# Patient Record
Sex: Male | Born: 1945 | Race: Black or African American | Hispanic: No | Marital: Single | Smoking: Former smoker
Health system: Southern US, Community
[De-identification: ages and names within clinical notes are randomized; demographics above are authoritative.]

## PROBLEM LIST (undated history)

## (undated) DIAGNOSIS — M1990 Unspecified osteoarthritis, unspecified site: Secondary | ICD-10-CM

## (undated) DIAGNOSIS — I10 Essential (primary) hypertension: Secondary | ICD-10-CM

## (undated) HISTORY — DX: Unspecified osteoarthritis, unspecified site: M19.90

---

## 2006-01-06 ENCOUNTER — Emergency Department: Payer: Self-pay | Admitting: Internal Medicine

## 2006-05-17 ENCOUNTER — Ambulatory Visit: Payer: Self-pay | Admitting: Specialist

## 2007-08-30 HISTORY — PX: COLONOSCOPY: SHX174

## 2007-09-11 ENCOUNTER — Encounter: Payer: Self-pay | Admitting: Family Medicine

## 2007-09-30 ENCOUNTER — Encounter: Payer: Self-pay | Admitting: Family Medicine

## 2008-12-15 ENCOUNTER — Ambulatory Visit: Payer: Self-pay | Admitting: Gastroenterology

## 2010-07-07 ENCOUNTER — Ambulatory Visit: Payer: Self-pay | Admitting: Family Medicine

## 2010-07-09 ENCOUNTER — Ambulatory Visit: Payer: Self-pay | Admitting: Internal Medicine

## 2010-12-04 ENCOUNTER — Ambulatory Visit: Payer: Self-pay | Admitting: Family Medicine

## 2012-08-29 HISTORY — PX: PROSTATE BIOPSY: SHX241

## 2014-04-24 LAB — BASIC METABOLIC PANEL
Creatinine: 12 mg/dL — AB (ref <=1.3)
GLUCOSE: 1 mg/dL

## 2014-04-24 LAB — LIPID PANEL
Cholesterol: 213 mg/dL — AB (ref 0–200)
HDL: 25 mg/dL — AB (ref 35–70)
TRIGLYCERIDES: 521 mg/dL — AB (ref 40–160)

## 2014-04-24 LAB — TSH: TSH: 1.1 u[IU]/mL (ref <=5.90)

## 2014-04-24 LAB — PSA: PSA: 0.6

## 2014-08-19 ENCOUNTER — Ambulatory Visit: Payer: Self-pay | Admitting: Emergency Medicine

## 2014-12-08 ENCOUNTER — Ambulatory Visit
Admit: 2014-12-08 | Disposition: A | Discharge status: Home / Self Care | Payer: Self-pay | Attending: Family Medicine | Admitting: Family Medicine

## 2015-04-09 ENCOUNTER — Other Ambulatory Visit: Payer: Self-pay | Admitting: Internal Medicine

## 2015-04-11 ENCOUNTER — Other Ambulatory Visit: Payer: Self-pay | Admitting: Internal Medicine

## 2015-04-11 ENCOUNTER — Encounter: Payer: Self-pay | Admitting: Internal Medicine

## 2015-04-11 DIAGNOSIS — F17201 Nicotine dependence, unspecified, in remission: Secondary | ICD-10-CM | POA: Insufficient documentation

## 2015-04-11 DIAGNOSIS — M109 Gout, unspecified: Secondary | ICD-10-CM | POA: Insufficient documentation

## 2015-04-11 DIAGNOSIS — R972 Elevated prostate specific antigen [PSA]: Secondary | ICD-10-CM

## 2015-04-11 DIAGNOSIS — D869 Sarcoidosis, unspecified: Secondary | ICD-10-CM | POA: Insufficient documentation

## 2015-04-11 DIAGNOSIS — I10 Essential (primary) hypertension: Secondary | ICD-10-CM | POA: Insufficient documentation

## 2015-04-11 DIAGNOSIS — J3089 Other allergic rhinitis: Secondary | ICD-10-CM | POA: Insufficient documentation

## 2015-04-11 DIAGNOSIS — E785 Hyperlipidemia, unspecified: Secondary | ICD-10-CM | POA: Insufficient documentation

## 2015-05-03 ENCOUNTER — Other Ambulatory Visit: Payer: Self-pay | Admitting: Internal Medicine

## 2015-07-02 ENCOUNTER — Ambulatory Visit: Payer: Self-pay | Admitting: Internal Medicine

## 2015-08-04 ENCOUNTER — Encounter: Payer: Self-pay | Admitting: Internal Medicine

## 2015-08-04 ENCOUNTER — Ambulatory Visit (INDEPENDENT_AMBULATORY_CARE_PROVIDER_SITE_OTHER): Payer: Medicare HMO | Admitting: Internal Medicine

## 2015-08-04 VITALS — BP 150/82 | HR 84 | Ht 64.0 in | Wt 203.8 lb

## 2015-08-04 DIAGNOSIS — E785 Hyperlipidemia, unspecified: Secondary | ICD-10-CM

## 2015-08-04 DIAGNOSIS — I10 Essential (primary) hypertension: Secondary | ICD-10-CM | POA: Diagnosis not present

## 2015-08-04 DIAGNOSIS — J3089 Other allergic rhinitis: Secondary | ICD-10-CM | POA: Diagnosis not present

## 2015-08-04 DIAGNOSIS — M1000 Idiopathic gout, unspecified site: Secondary | ICD-10-CM

## 2015-08-04 MED ORDER — ATORVASTATIN CALCIUM 20 MG PO TABS: 20.0000 mg | ORAL_TABLET | Freq: Every day | ORAL | Status: DC

## 2015-08-04 MED ORDER — AMLODIPINE BESYLATE 10 MG PO TABS: 10.0000 mg | ORAL_TABLET | Freq: Every day | ORAL | Status: DC

## 2015-08-04 MED ORDER — IRBESARTAN 300 MG PO TABS: 300.0000 mg | ORAL_TABLET | Freq: Every day | ORAL | Status: DC

## 2015-08-04 MED ORDER — CARVEDILOL PHOSPHATE ER 20 MG PO CP24: 20.0000 mg | ORAL_CAPSULE | Freq: Every day | ORAL | Status: DC

## 2015-08-04 NOTE — Patient Instructions (Signed)
Take Allegra 180 mg once a day (plain allegra) Continue to use Flonase Nasal spray daily

## 2015-08-04 NOTE — Progress Notes (Signed)
Date:  08/04/2015   Name:  Edward Rogers   DOB:  [birth year]   MRN:  [id number]   Chief Complaint: Hypertension Hypertension This is a chronic problem. The current episode started more than 1 year ago. Pertinent negatives include no blurred vision, chest pain, headaches, palpitations or shortness of breath. Past treatments include calcium channel blockers and angiotensin blockers. Compliance problems include medication side effects (A.m. amlodipine dose makes him sleepy).   Hyperlipidemia This is a chronic problem. The current episode started more than 1 year ago. The problem is uncontrolled. Recent lipid tests were reviewed and are high. Pertinent negatives include no chest pain or shortness of breath. Current antihyperlipidemic treatment includes statins. Risk factors for coronary artery disease include dyslipidemia, hypertension and male sex.   Gout -often gets flares in ankle and toe.   His last episode was three months ago. He uses indomethacin and colchicine as needed. He also has dental work that needs to be done and they need reassurance that his blood pressures are well controlled.  Nasal allergies - patient states he's had postnasal drip and minor sore throat since he was treated for an ear infection several months ago. Was previously using Flonase but that made him sneeze. Claritin Claritin kept him awake and Benadryl is too sedating. Review of Systems  Constitutional: Negative for chills, diaphoresis and fatigue.  HENT: Positive for congestion and dental problem. Negative for sore throat and tinnitus.   Eyes: Negative for blurred vision.  Respiratory: Negative for cough, chest tightness and shortness of breath.   Cardiovascular: Negative for chest pain, palpitations and leg swelling.  Gastrointestinal: Negative for abdominal pain.  Neurological: Negative for dizziness, light-headedness and headaches.    Patient Active Problem List   Diagnosis Date Noted  . Gout 04/11/2015  .  Dyslipidemia 04/11/2015  . Essential (primary) hypertension 04/11/2015  . Sarcoidosis (HCC) 04/11/2015  . Other allergic rhinitis 04/11/2015  . Tobacco use disorder, moderate, in sustained remission 04/11/2015  . Elevated PSA 04/11/2015    Prior to Admission medications   Medication Sig Start Date End Date Taking? Authorizing Provider  amLODipine (NORVASC) 10 MG tablet Take 1 tablet by mouth 2 (two) times daily. 04/24/14  Yes Historical Provider, MD  atorvastatin (LIPITOR) 20 MG tablet Take 1 tablet by mouth at bedtime. 04/24/14  Yes Historical Provider, MD  betamethasone valerate (VALISONE) 0.1 % cream BETAMETHASONE VALERATE, 0.1% (External Cream)  1 Application Application BID for 0 days  Quantity: 45;  Refills: 5   Ordered :10-Apr-2012  Bari Edward M.D.; Active   Yes Historical Provider, MD  colchicine 0.6 MG tablet TAKE ONE TABLET BY MOUTH TWICE DAILY FOR GOUT 04/11/15  Yes Reubin Milan, MD  fluticasone [facility]) 50 MCG/ACT nasal spray Place 2 sprays into the nose daily as needed. 04/24/14  Yes Historical Provider, MD  indomethacin (INDOCIN) 25 MG capsule TAKE ONE CAPSULE BY MOUTH THREE TIMES DAILY-GOUT 05/03/15  Yes Reubin Milan, MD  irbesartan (AVAPRO) 300 MG tablet Take 1 tablet by mouth daily. 11/21/14  Yes Historical Provider, MD    No Known Allergies  Past Surgical History  Procedure Laterality Date  . Prostate biopsy  2014    normal  . Colonoscopy  2009    normal    Social History  Substance Use Topics  . Smoking status: Former Smoker    Quit date: 08/30/2012  . Smokeless tobacco: None  . Alcohol Use: No    Medication list has been reviewed and updated.  Physical Exam  Constitutional: He appears well-developed and well-nourished.  HENT:  Right Ear: Tympanic membrane and ear canal normal.  Left Ear: Tympanic membrane and ear canal normal.  Nose: Right sinus exhibits no maxillary sinus tenderness. Left sinus exhibits no maxillary sinus tenderness.   Mouth/Throat: Oropharynx is clear and moist. No posterior oropharyngeal erythema.  Neck: Normal range of motion. Neck supple. Carotid bruit is not present.  Cardiovascular: Normal rate, regular rhythm and normal heart sounds.   Pulmonary/Chest: Effort normal and breath sounds normal. No respiratory distress. He has no wheezes.  Lymphadenopathy:    He has no cervical adenopathy.  Nursing note and vitals reviewed.   BP 150/82 mmHg  Pulse 84  Ht 5\' 4"  (1.626 m)  Wt 203 lb 12.8 oz (92.443 kg)  BMI 34.96 kg/m2  Assessment and Plan: 1. Essential (primary) hypertension Only fair control and patient complains of sedation from amlodipine taken in the morning Change amlodipine to once a day at bedtime; continue Avapro once daily at bedtime and add Coreg CR once daily at bedtime BP adequately controlled for dental work - amLODipine (NORVASC) 10 MG tablet; Take 1 tablet (10 mg total) by mouth daily.  Dispense: 90 tablet; Refill: 1 - irbesartan (AVAPRO) 300 MG tablet; Take 1 tablet (300 mg total) by mouth daily.  Dispense: 90 tablet; Refill: 1 - carvedilol (COREG CR) 20 MG 24 hr capsule; Take 1 capsule (20 mg total) by mouth daily.  Dispense: 90 capsule; Refill: 1  2. Other allergic rhinitis Resume Flonase nasal spray and add Allegra 180 mg daily  3. Dyslipidemia On statin therapy  4. Idiopathic gout, unspecified chronicity, unspecified site Continue colchicine and indomethacin as needed   Bari EdwardLaura Karter Haire, MD [facility]  08/04/2015

## 2015-08-13 DIAGNOSIS — R69 Illness, unspecified: Secondary | ICD-10-CM | POA: Diagnosis not present

## 2015-11-04 ENCOUNTER — Encounter: Payer: Self-pay | Admitting: Internal Medicine

## 2015-11-04 ENCOUNTER — Ambulatory Visit (INDEPENDENT_AMBULATORY_CARE_PROVIDER_SITE_OTHER): Payer: Medicare HMO | Admitting: Internal Medicine

## 2015-11-04 VITALS — BP 138/82 | HR 80 | Ht 64.0 in | Wt 206.6 lb

## 2015-11-04 DIAGNOSIS — Z23 Encounter for immunization: Secondary | ICD-10-CM | POA: Diagnosis not present

## 2015-11-04 DIAGNOSIS — Z0000 Encounter for general adult medical examination without abnormal findings: Secondary | ICD-10-CM

## 2015-11-04 DIAGNOSIS — D869 Sarcoidosis, unspecified: Secondary | ICD-10-CM | POA: Diagnosis not present

## 2015-11-04 DIAGNOSIS — M19012 Primary osteoarthritis, left shoulder: Secondary | ICD-10-CM | POA: Diagnosis not present

## 2015-11-04 DIAGNOSIS — M1000 Idiopathic gout, unspecified site: Secondary | ICD-10-CM

## 2015-11-04 DIAGNOSIS — R972 Elevated prostate specific antigen [PSA]: Secondary | ICD-10-CM

## 2015-11-04 DIAGNOSIS — I10 Essential (primary) hypertension: Secondary | ICD-10-CM | POA: Diagnosis not present

## 2015-11-04 DIAGNOSIS — E785 Hyperlipidemia, unspecified: Secondary | ICD-10-CM

## 2015-11-04 DIAGNOSIS — Z720 Tobacco use: Secondary | ICD-10-CM | POA: Diagnosis not present

## 2015-11-04 DIAGNOSIS — F17201 Nicotine dependence, unspecified, in remission: Secondary | ICD-10-CM

## 2015-11-04 LAB — POCT URINALYSIS DIPSTICK
Bilirubin, UA: NEGATIVE
GLUCOSE UA: NEGATIVE
KETONES UA: NEGATIVE
LEUKOCYTES UA: NEGATIVE
NITRITE UA: NEGATIVE
Protein, UA: 30
RBC UA: NEGATIVE
SPEC GRAV UA: 1.025
Urobilinogen, UA: 0.2
pH, UA: 7

## 2015-11-04 MED ORDER — FLUTICASONE PROPIONATE 50 MCG/ACT NA SUSP: 2.0000 | Freq: Every day | NASAL | Status: DC | PRN

## 2015-11-04 NOTE — Patient Instructions (Addendum)
Health Maintenance  Topic Date Due  . Hepatitis C Screening  [birth year]  . TETANUS/TDAP  12/29/1962  . ZOSTAVAX  12/29/2003  . PNA vac Low Risk Adult (1 of 2 - PCV13) 12/28/2008  . INFLUENZA VACCINE  11/27/2015 (Originally 03/30/2015)  . COLONOSCOPY  12/16/2018   Pneumococcal Conjugate Vaccine (PCV13)  1. Why get vaccinated? Vaccination can protect both children and adults from pneumococcal disease. Pneumococcal disease is caused by bacteria that can spread from person to person through close contact. It can cause ear infections, and it can also lead to more serious infections of the:  Lungs (pneumonia),  Blood (bacteremia), and  Covering of the brain and spinal cord (meningitis). Pneumococcal pneumonia is most common among adults. Pneumococcal meningitis can cause deafness and brain damage, and it kills about 1 child in 10 who get it. Anyone can get pneumococcal disease, but children under 27 years of age and adults 61 years and older, people with certain medical conditions, and cigarette smokers are at the highest risk. Before there was a vaccine, the [place] saw:  more than 700 cases of meningitis,  about 13,000 blood infections,  about 5 million ear infections, and  about 200 deaths in children under 5 each year from pneumococcal disease. Since vaccine became available, severe pneumococcal disease in these children has fallen by 88%. About 18,000 older adults die of pneumococcal disease each year in the [place]. Treatment of pneumococcal infections with penicillin and other drugs is not as effective as it used to be, because some strains of the disease have become resistant to these drugs. This makes prevention of the disease, through vaccination, even more important. 2. PCV13 vaccine Pneumococcal conjugate vaccine (called PCV13) protects against 13 types of pneumococcal bacteria. PCV13 is routinely given to children at 2, 4, 6, and 13-40 months of age. It is also  recommended for children and adults 74 to 18 years of age with certain health conditions, and for all adults 28 years of age and older. Your doctor can give you details. 3. Some people should not get this vaccine Anyone who has ever had a life-threatening allergic reaction to a dose of this vaccine, to an earlier pneumococcal vaccine called PCV7, or to any vaccine containing diphtheria toxoid (for example, DTaP), should not get PCV13. Anyone with a severe allergy to any component of PCV13 should not get the vaccine. Tell your doctor if the person being vaccinated has any severe allergies. If the person scheduled for vaccination is not feeling well, your healthcare provider might decide to reschedule the shot on another day. 4. Risks of a vaccine reaction With any medicine, including vaccines, there is a chance of reactions. These are usually mild and go away on their own, but serious reactions are also possible. Problems reported following PCV13 varied by age and dose in the series. The most common problems reported among children were:  About half became drowsy after the shot, had a temporary loss of appetite, or had redness or tenderness where the shot was given.  About 1 out of 3 had swelling where the shot was given.  About 1 out of 3 had a mild fever, and about 1 in 20 had a fever over 102.66F.  Up to about 8 out of 10 became fussy or irritable. Adults have reported pain, redness, and swelling where the shot was given; also mild fever, fatigue, headache, chills, or muscle pain. Young children who get PCV13 along with inactivated flu vaccine at the same time  may be at increased risk for seizures caused by fever. Ask your doctor for more information. Problems that could happen after any vaccine:  People sometimes faint after a medical procedure, including vaccination. Sitting or lying down for about 15 minutes can help prevent fainting, and injuries caused by a fall. Tell your doctor if you  feel dizzy, or have vision changes or ringing in the ears.  Some older children and adults get severe pain in the shoulder and have difficulty moving the arm where a shot was given. This happens very rarely.  Any medication can cause a severe allergic reaction. Such reactions from a vaccine are very rare, estimated at about 1 in a million doses, and would happen within a few minutes to a few hours after the vaccination. As with any medicine, there is a very small chance of a vaccine causing a serious injury or death. The safety of vaccines is always being monitored. For more information, visit: http://floyd.org/www.cdc.gov/vaccinesafety/ 5. What if there is a serious reaction? What should I look for?  Look for anything that concerns you, such as signs of a severe allergic reaction, very high fever, or unusual behavior. Signs of a severe allergic reaction can include hives, swelling of the face and throat, difficulty breathing, a fast heartbeat, dizziness, and weakness-usually within a few minutes to a few hours after the vaccination. What should I do?  If you think it is a severe allergic reaction or other emergency that can't wait, call 9-1-1 or get the person to the nearest hospital. Otherwise, call your doctor. Reactions should be reported to the Vaccine Adverse Event Reporting System (VAERS). Your doctor should file this report, or you can do it yourself through the VAERS web site at www.vaers.LAgents.nohhs.gov, or by calling 1-6182961435. VAERS does not give medical advice. 6. The National Vaccine Injury Compensation Program The [employer] Vaccine Injury Compensation Program (VICP) is a federal program that was created to compensate people who may have been injured by certain vaccines. Persons who believe they may have been injured by a vaccine can learn about the program and about filing a claim by calling 1-3198459526 or visiting the VICP website at SpiritualWord.atwww.hrsa.gov/vaccinecompensation. There is a time limit to file a  claim for compensation. 7. How can I learn more?  Ask your healthcare provider. He or she can give you the vaccine package insert or suggest other sources of information.  Call your local or state health department.  Contact the Centers for Disease Control and Prevention (CDC):  Call 864-594-70021-(601)189-0486 (1-800-CDC-INFO) or  Visit CDC's website at PicCapture.uywww.cdc.gov/vaccines Vaccine Information Statement PCV13 Vaccine (07/03/2014)   This information is not intended to replace advice given to you by your health care provider. Make sure you discuss any questions you have with your health care provider.   Document Released: 06/12/2006 Document Revised: 09/05/2014 Document Reviewed: 07/10/2014 Elsevier Interactive Patient Education [employer]2016 Elsevier Inc.

## 2015-11-04 NOTE — Progress Notes (Signed)
Patient: Edward Rogers, Male    DOB: 09/29/1945, 70 y.o.   MRN: [id number] Visit Date: 11/04/2015  Today's Provider: Bari EdwardLaura Ariadna Setter, MD   Chief Complaint  Patient presents with  . Medicare Wellness  . Hypertension   Subjective:    Annual wellness visit Edward Rogers is a 70 y.o. male who presents today for his Subsequent Annual Wellness Visit. He feels well. He reports exercising very little. He reports he is sleeping well.   ----------------------------------------------------------- Hypertension This is a chronic problem. The current episode started more than 1 year ago. The problem has been waxing and waning since onset. The problem is uncontrolled. Pertinent negatives include no chest pain, headaches, palpitations or shortness of breath. Associated agents: last visit I wanted to add coreg but he could not afford it.  Shoulder Pain  The pain is present in the left shoulder. This is a chronic problem. The current episode started more than 1 year ago. The problem occurs intermittently. The quality of the pain is described as aching. Pertinent negatives include no fever. He has tried NSAIDS for the symptoms. The treatment provided moderate relief.  Hyperlipidemia This is a chronic problem. The problem is controlled. Recent lipid tests were reviewed and are normal. Pertinent negatives include no chest pain, myalgias or shortness of breath. Current antihyperlipidemic treatment includes statins.    Review of Systems  Constitutional: Negative for fever, chills, fatigue and unexpected weight change.  HENT: Negative for hearing loss, tinnitus, trouble swallowing and voice change.   Eyes: Negative for visual disturbance.  Respiratory: Negative for cough, chest tightness, shortness of breath and wheezing.        No chest symptoms to suggest recurrence of Sarcoid   Cardiovascular: Negative for chest pain, palpitations and leg swelling.  Gastrointestinal: Negative for abdominal  pain, diarrhea and blood in stool.  Genitourinary: Negative for dysuria, frequency and difficulty urinating.  Musculoskeletal: Positive for arthralgias. Negative for myalgias, joint swelling and gait problem.  Neurological: Negative for dizziness and headaches.  Hematological: Negative for adenopathy.  Psychiatric/Behavioral: Negative for sleep disturbance and dysphoric mood.    Social History   Social History  . Marital Status: Single    Spouse Name: N/A  . Number of Children: N/A  . Years of Education: N/A   Occupational History  . Not on file.   Social History Main Topics  . Smoking status: Former Smoker    Quit date: 08/30/2012  . Smokeless tobacco: Not on file  . Alcohol Use: 0.0 oz/week    0 Standard drinks or equivalent per week     Comment: rarely  . Drug Use: No  . Sexual Activity: Not on file   Other Topics Concern  . Not on file   Social History Narrative    Patient Active Problem List   Diagnosis Date Noted  . Gout 04/11/2015  . Dyslipidemia 04/11/2015  . Essential (primary) hypertension 04/11/2015  . Sarcoidosis (HCC) 04/11/2015  . Other allergic rhinitis 04/11/2015  . Tobacco use disorder, moderate, in sustained remission 04/11/2015  . Elevated PSA 04/11/2015    Past Surgical History  Procedure Laterality Date  . Prostate biopsy  2014    normal  . Colonoscopy  2009    normal    His family history includes Cancer in his mother; Heart failure in his father.    Previous Medications   AMLODIPINE (NORVASC) 10 MG TABLET    Take 1 tablet (10 mg total) by mouth daily.   ATORVASTATIN (LIPITOR) 20  MG TABLET    Take 1 tablet (20 mg total) by mouth at bedtime.   BETAMETHASONE VALERATE (VALISONE) 0.1 % CREAM    Reported on 11/04/2015   CARVEDILOL (COREG CR) 20 MG 24 HR CAPSULE    Take 1 capsule (20 mg total) by mouth daily.   COLCHICINE 0.6 MG TABLET    TAKE ONE TABLET BY MOUTH TWICE DAILY FOR GOUT   FLUTICASONE (FLONASE) 50 MCG/ACT NASAL SPRAY    Place 2  sprays into the nose daily as needed.   INDOMETHACIN (INDOCIN) 25 MG CAPSULE    TAKE ONE CAPSULE BY MOUTH THREE TIMES DAILY-GOUT   IRBESARTAN (AVAPRO) 300 MG TABLET    Take 1 tablet (300 mg total) by mouth daily.    Patient Care Team: Reubin Milan, MD as PCP - General (Family Medicine) Harle Battiest, PA-C as Physician Assistant (Urology)     Objective:   Vitals: BP 146/78 mmHg  Pulse 80  Ht  (1.626 m)  Wt 206 lb 9.6 oz (93.713 kg)  BMI 35.45 kg/m2  Physical Exam  Constitutional: He is oriented to person, place, and time. He appears well-developed and well-nourished.  HENT:  Head: Normocephalic.  Right Ear: Tympanic membrane, external ear and ear canal normal.  Left Ear: Tympanic membrane, external ear and ear canal normal.  Nose: Nose normal.  Mouth/Throat: Uvula is midline and oropharynx is clear and moist.  Eyes: Conjunctivae and EOM are normal. Pupils are equal, round, and reactive to light.  Neck: Normal range of motion. Neck supple. Carotid bruit is not present. No thyromegaly present.  Cardiovascular: Normal rate, regular rhythm, normal heart sounds and intact distal pulses.   Pulmonary/Chest: Effort normal and breath sounds normal. He has no wheezes. Right breast exhibits no mass. Left breast exhibits no mass.  Abdominal: Soft. Normal appearance and bowel sounds are normal. There is no hepatosplenomegaly. There is no tenderness.  Musculoskeletal: Normal range of motion.       Left shoulder: He exhibits tenderness and crepitus. He exhibits no effusion and no deformity.  Lymphadenopathy:    He has no cervical adenopathy.  Neurological: He is alert and oriented to person, place, and time. He has normal reflexes. No cranial nerve deficit or sensory deficit.  Skin: Skin is warm, dry and intact.  Psychiatric: He has a normal mood and affect. His speech is normal and behavior is normal. Judgment and thought content normal. Cognition and memory are normal.  Nursing  note and vitals reviewed.   Activities of Daily Living In your present state of health, do you have any difficulty performing the following activities: 11/04/2015 08/04/2015  Hearing? N N  Vision? N N  Difficulty concentrating or making decisions? N N  Walking or climbing stairs? Y N  Dressing or bathing? N N  Doing errands, shopping? N N    Fall Risk Assessment Fall Risk  11/04/2015  Falls in the past year? No      Depression Screen PHQ 2/9 Scores 11/04/2015  PHQ - 2 Score 0    Cognitive Testing - 6-CIT   Correct? Score   What year is it? yes 0 Yes = 0    No = 4  What month is it? yes 0 Yes = 0    No = 3  Remember:     Floyde Parkins, [address], [place]     What time is it? yes 0 Yes = 0    No = 3  Count backwards  from 20 to 1 yes 0 Correct = 0    1 error = 2   More than 1 error = 4  Say the months of the year in reverse. no 1 Correct = 0    1 error = 2   More than 1 error = 4  What address did I ask you to remember? no 2 Correct = 0  1 error = 2    2 error = 4    3 error = 6    4 error = 8    All wrong = 10       TOTAL SCORE  3/28   Interpretation:  Normal  Normal (0-7) Abnormal (8-28)        Medicare Annual Wellness Visit Summary:  Reviewed patient's Family Medical History Reviewed and updated list of patient's medical providers Assessment of cognitive impairment was done Assessed patient's functional ability Established a written schedule for health screening services Health Risk Assessent Completed and Reviewed  Exercise Activities and Dietary recommendations Goals    None      Immunization History  Administered Date(s) Administered  . Pneumococcal Polysaccharide-23 08/31/2007    Health Maintenance  Topic Date Due  . Hepatitis C Screening  03/14/1946  . TETANUS/TDAP  12/29/1962  . COLONOSCOPY  12/28/1993  . ZOSTAVAX  12/29/2003  . PNA vac Low Risk Adult (1 of 2 - PCV13) 12/28/2008  . INFLUENZA VACCINE  11/27/2015 (Originally 03/30/2015)       Discussed health benefits of physical activity, and encouraged him to engage in regular exercise appropriate for his age and condition.    ------------------------------------------------------------------------------------------------------------   Assessment & Plan:  1. Medicare annual wellness visit, subsequent Medicare wellness measure satisfied  2. Essential (primary) hypertension Fair control - consider adding bystolic or metoprolol  He will work on 10-15 lb weight loss over the next 6 months - CBC with Differential/Platelet - Comprehensive metabolic panel - POCT urinalysis dipstick  3. Dyslipidemia On appropriate statin therapy - Lipid panel  4. Tobacco use disorder, moderate, in sustained remission  5. Elevated PSA Seen in the past by urology with a benign biopsy - PSA  6. Need for pneumococcal vaccination - Pneumococcal conjugate vaccine 13-valent IM  7. Primary osteoarthritis of left shoulder Continue naproxen twice a day as needed Referred orthophoric possible cortisone injection if worsening  8. Idiopathic gout, unspecified chronicity, unspecified site Stable with intermittent occurrences - Uric acid  9. Sarcoidosis (HCC) Asymptomatic   Bari Edward, MD [facility]  11/04/2015

## 2015-11-05 LAB — LIPID PANEL
CHOL/HDL RATIO: 6.3 ratio — AB (ref 0.0–5.0)
CHOLESTEROL TOTAL: 182 mg/dL (ref 100–199)
HDL: 29 mg/dL — ABNORMAL LOW (ref >39)
LDL Calculated: 86 mg/dL (ref 0–99)
TRIGLYCERIDES: 333 mg/dL — AB (ref 0–149)
VLDL Cholesterol Cal: 67 mg/dL — ABNORMAL HIGH (ref 5–40)

## 2015-11-05 LAB — CBC WITH DIFFERENTIAL/PLATELET
BASOS: 1 %
Basophils Absolute: 0.1 10*3/uL (ref 0.0–0.2)
EOS (ABSOLUTE): 0.7 10*3/uL — ABNORMAL HIGH (ref 0.0–0.4)
Eos: 15 %
HEMOGLOBIN: 14.1 g/dL (ref 12.6–17.7)
Hematocrit: 43.1 % (ref 37.5–51.0)
IMMATURE GRANS (ABS): 0 10*3/uL (ref 0.0–0.1)
Immature Granulocytes: 0 %
LYMPHS ABS: 1.8 10*3/uL (ref 0.7–3.1)
LYMPHS: 37 %
MCH: 27.2 pg (ref 26.6–33.0)
MCHC: 32.7 g/dL (ref 31.5–35.7)
MCV: 83 fL (ref 79–97)
MONOCYTES: 11 %
Monocytes Absolute: 0.5 10*3/uL (ref 0.1–0.9)
NEUTROS ABS: 1.8 10*3/uL (ref 1.4–7.0)
Neutrophils: 36 %
Platelets: 264 10*3/uL (ref 150–379)
RBC: 5.18 x10E6/uL (ref 4.14–5.80)
RDW: 14.5 % (ref 12.3–15.4)
WBC: 4.9 10*3/uL (ref 3.4–10.8)

## 2015-11-05 LAB — COMPREHENSIVE METABOLIC PANEL
A/G RATIO: 1.3 (ref 1.1–2.5)
ALBUMIN: 4.2 g/dL (ref 3.5–4.8)
ALK PHOS: 102 IU/L (ref 39–117)
ALT: 18 IU/L (ref 0–44)
AST: 20 IU/L (ref 0–40)
BILIRUBIN TOTAL: 0.3 mg/dL (ref 0.0–1.2)
BUN/Creatinine Ratio: 11 (ref 10–22)
BUN: 12 mg/dL (ref 8–27)
CO2: 21 mmol/L (ref 18–29)
Calcium: 9.1 mg/dL (ref 8.6–10.2)
Chloride: 103 mmol/L (ref 96–106)
Creatinine, Ser: 1.12 mg/dL (ref 0.76–1.27)
GFR calc Af Amer: 76 mL/min/{1.73_m2} (ref >59)
GFR calc non Af Amer: 66 mL/min/{1.73_m2} (ref >59)
GLOBULIN, TOTAL: 3.3 g/dL (ref 1.5–4.5)
Glucose: 103 mg/dL — ABNORMAL HIGH (ref 65–99)
POTASSIUM: 4 mmol/L (ref 3.5–5.2)
SODIUM: 144 mmol/L (ref 134–144)
TOTAL PROTEIN: 7.5 g/dL (ref 6.0–8.5)

## 2015-11-05 LAB — PSA: PROSTATE SPECIFIC AG, SERUM: 0.4 ng/mL (ref 0.0–4.0)

## 2015-11-05 LAB — URIC ACID: Uric Acid: 9.6 mg/dL — ABNORMAL HIGH (ref 3.7–8.6)

## 2015-11-06 ENCOUNTER — Telehealth: Payer: Self-pay

## 2015-11-06 NOTE — Telephone Encounter (Signed)
-----   Message from Reubin MilanLaura H Berglund, MD sent at 11/05/2015  7:59 AM EST ----- Labs are normal except for high triglycerides.  Continue same medications but work on low fat diet to help with weight loss and to lower triglycerides.

## 2015-11-06 NOTE — Telephone Encounter (Signed)
Spoke with patient. Patient advised of all results and verbalized understanding. Will call back with any future questions or concerns. MAH  

## 2015-11-06 NOTE — Telephone Encounter (Signed)
Left message for patient to call back  

## 2016-04-20 ENCOUNTER — Other Ambulatory Visit: Payer: Self-pay | Admitting: Internal Medicine

## 2016-05-06 ENCOUNTER — Ambulatory Visit (INDEPENDENT_AMBULATORY_CARE_PROVIDER_SITE_OTHER): Payer: Medicare HMO | Admitting: Internal Medicine

## 2016-05-06 ENCOUNTER — Encounter: Payer: Self-pay | Admitting: Internal Medicine

## 2016-05-06 VITALS — BP 142/78 | HR 79 | Resp 16 | Ht 64.0 in | Wt 196.0 lb

## 2016-05-06 DIAGNOSIS — M1000 Idiopathic gout, unspecified site: Secondary | ICD-10-CM

## 2016-05-06 DIAGNOSIS — I10 Essential (primary) hypertension: Secondary | ICD-10-CM | POA: Diagnosis not present

## 2016-05-06 DIAGNOSIS — E785 Hyperlipidemia, unspecified: Secondary | ICD-10-CM | POA: Diagnosis not present

## 2016-05-06 MED ORDER — AMLODIPINE BESYLATE 10 MG PO TABS: 10.0000 mg | ORAL_TABLET | Freq: Every day | ORAL | 1 refills | Status: DC

## 2016-05-06 MED ORDER — ATORVASTATIN CALCIUM 20 MG PO TABS: 20.0000 mg | ORAL_TABLET | Freq: Every day | ORAL | 1 refills | Status: DC

## 2016-05-06 MED ORDER — IRBESARTAN 300 MG PO TABS: 300.0000 mg | ORAL_TABLET | Freq: Every day | ORAL | 1 refills | Status: DC

## 2016-05-06 NOTE — Progress Notes (Signed)
Date:  05/06/2016   Name:  Edward Rogers   DOB:  10/28/1945   MRN:  [id number]   Chief Complaint: Hypertension Hypertension  This is a chronic problem. The current episode started more than 1 year ago. The problem is unchanged. The problem is resistant. Pertinent negatives include no blurred vision, chest pain, headaches, palpitations, peripheral edema or shortness of breath. Past treatments include angiotensin blockers, diuretics and calcium channel blockers.    Review of Systems  Constitutional: Negative for appetite change, fatigue and unexpected weight change.  Eyes: Negative for blurred vision and visual disturbance.  Respiratory: Negative for cough, shortness of breath and wheezing.   Cardiovascular: Negative for chest pain, palpitations and leg swelling.  Gastrointestinal: Negative for abdominal pain and blood in stool.  Endocrine: Negative for polydipsia and polyuria.  Genitourinary: Negative for dysuria and hematuria.  Skin: Negative for color change and rash.  Neurological: Negative for tremors, numbness and headaches.  Psychiatric/Behavioral: Negative for dysphoric mood.    Patient Active Problem List   Diagnosis Date Noted  . Gout 04/11/2015  . Dyslipidemia 04/11/2015  . Essential (primary) hypertension 04/11/2015  . Sarcoidosis (HCC) 04/11/2015  . Other allergic rhinitis 04/11/2015  . Tobacco use disorder, moderate, in sustained remission 04/11/2015  . Elevated PSA 04/11/2015    Prior to Admission medications   Medication Sig Start Date End Date Taking? Authorizing Provider  amLODipine (NORVASC) 10 MG tablet Take 1 tablet (10 mg total) by mouth daily. 08/04/15  Yes Reubin MilanLaura H Donna Snooks, MD  atorvastatin (LIPITOR) 20 MG tablet Take 1 tablet (20 mg total) by mouth at bedtime. 08/04/15  Yes Reubin MilanLaura H Alajah Witman, MD  betamethasone valerate (VALISONE) 0.1 % cream Reported on 11/04/2015   Yes Historical Provider, MD  colchicine 0.6 MG tablet TAKE ONE TABLET BY MOUTH TWICE  DAILY FOR GOUT 04/20/16  Yes Reubin MilanLaura H Jestine Bicknell, MD  fluticasone [facility](FLONASE) 50 MCG/ACT nasal spray Place 2 sprays into both nostrils daily as needed. 11/04/15  Yes Reubin MilanLaura H Laketa Sandoz, MD  indomethacin (INDOCIN) 25 MG capsule TAKE ONE CAPSULE BY MOUTH THREE TIMES DAILY FOR GOUT 04/20/16  Yes Reubin MilanLaura H Katey Barrie, MD  irbesartan (AVAPRO) 300 MG tablet Take 1 tablet (300 mg total) by mouth daily. 08/04/15  Yes Reubin MilanLaura H Madden Piazza, MD    No Known Allergies  Past Surgical History:  Procedure Laterality Date  . COLONOSCOPY  2009   normal  . PROSTATE BIOPSY  2014   normal    Social History  Substance Use Topics  . Smoking status: Former Smoker    Quit date: 08/30/2012  . Smokeless tobacco: Never Used  . Alcohol use 0.0 oz/week     Comment: rarely     Medication list has been reviewed and updated.   Physical Exam  Constitutional: He is oriented to person, place, and time. He appears well-developed. No distress.  HENT:  Head: Normocephalic and atraumatic.  Neck: Carotid bruit is not present.  Cardiovascular: Normal rate, regular rhythm and normal heart sounds.   Pulmonary/Chest: Effort normal and breath sounds normal. No respiratory distress.  Musculoskeletal: Normal range of motion. He exhibits no edema or tenderness.  Neurological: He is alert and oriented to person, place, and time.  Skin: Skin is warm and dry. No rash noted.  Psychiatric: He has a normal mood and affect. His behavior is normal. Thought content normal.  Nursing note and vitals reviewed.   BP (!) 142/78   Pulse 79   Resp 16   Ht 5'  4" (1.626 m)   Wt 196 lb (88.9 kg)   SpO2 98%   BMI 33.64 kg/m   Assessment and Plan: 1. Essential (primary) hypertension improved - amLODipine (NORVASC) 10 MG tablet; Take 1 tablet (10 mg total) by mouth daily.  Dispense: 90 tablet; Refill: 1 - irbesartan (AVAPRO) 300 MG tablet; Take 1 tablet (300 mg total) by mouth daily.  Dispense: 90 tablet; Refill: 1  2. Dyslipidemia Continue low fat  diet and weight loss Consider adding EPA next visit  - atorvastatin (LIPITOR) 20 MG tablet; Take 1 tablet (20 mg total) by mouth at bedtime.  Dispense: 90 tablet; Refill: 1  3. Idiopathic gout, unspecified chronicity, unspecified site Controlled - no recent severe flares   Bari Edward, MD [facility]  05/06/2016

## 2016-05-06 NOTE — Patient Instructions (Signed)
DASH Eating Plan  DASH stands for "Dietary Approaches to Stop Hypertension." The DASH eating plan is a healthy eating plan that has been shown to reduce high blood pressure (hypertension). Additional health benefits may include reducing the risk of type 2 diabetes mellitus, heart disease, and stroke. The DASH eating plan may also help with weight loss.  WHAT DO I NEED TO KNOW ABOUT THE DASH EATING PLAN?  For the DASH eating plan, you will follow these general guidelines:  · Choose foods with a percent daily value for sodium of less than 5% (as listed on the food label).  · Use salt-free seasonings or herbs instead of table salt or sea salt.  · Check with your health care provider or pharmacist before using salt substitutes.  · Eat lower-sodium products, often labeled as "lower sodium" or "no salt added."  · Eat fresh foods.  · Eat more vegetables, fruits, and low-fat dairy products.  · Choose whole grains. Look for the word "whole" as the first word in the ingredient list.  · Choose fish and skinless chicken or turkey more often than red meat. Limit fish, poultry, and meat to 6 oz (170 g) each day.  · Limit sweets, desserts, sugars, and sugary drinks.  · Choose heart-healthy fats.  · Limit cheese to 1 oz (28 g) per day.  · Eat more home-cooked food and less restaurant, buffet, and fast food.  · Limit fried foods.  · Cook foods using methods other than frying.  · Limit canned vegetables. If you do use them, rinse them well to decrease the sodium.  · When eating at a restaurant, ask that your food be prepared with less salt, or no salt if possible.  WHAT FOODS CAN I EAT?  Seek help from a dietitian for individual calorie needs.  Grains  Whole grain or whole wheat bread. Brown rice. Whole grain or whole wheat pasta. Quinoa, bulgur, and whole grain cereals. Low-sodium cereals. Corn or whole wheat flour tortillas. Whole grain cornbread. Whole grain crackers. Low-sodium crackers.  Vegetables  Fresh or frozen vegetables  (raw, steamed, roasted, or grilled). Low-sodium or reduced-sodium tomato and vegetable juices. Low-sodium or reduced-sodium tomato sauce and paste. Low-sodium or reduced-sodium canned vegetables.   Fruits  All fresh, canned (in natural juice), or frozen fruits.  Meat and Other Protein Products  Ground beef (85% or leaner), grass-fed beef, or beef trimmed of fat. Skinless chicken or turkey. Ground chicken or turkey. Pork trimmed of fat. All fish and seafood. Eggs. Dried beans, peas, or lentils. Unsalted nuts and seeds. Unsalted canned beans.  Dairy  Low-fat dairy products, such as skim or 1% milk, 2% or reduced-fat cheeses, low-fat ricotta or cottage cheese, or plain low-fat yogurt. Low-sodium or reduced-sodium cheeses.  Fats and Oils  Tub margarines without trans fats. Light or reduced-fat mayonnaise and salad dressings (reduced sodium). Avocado. Safflower, olive, or canola oils. Natural peanut or almond butter.  Other  Unsalted popcorn and pretzels.  The items listed above may not be a complete list of recommended foods or beverages. Contact your dietitian for more options.  WHAT FOODS ARE NOT RECOMMENDED?  Grains  White bread. White pasta. White rice. Refined cornbread. Bagels and croissants. Crackers that contain trans fat.  Vegetables  Creamed or fried vegetables. Vegetables in a cheese sauce. Regular canned vegetables. Regular canned tomato sauce and paste. Regular tomato and vegetable juices.  Fruits  Dried fruits. Canned fruit in light or heavy syrup. Fruit juice.  Meat and Other Protein   Products  Fatty cuts of meat. Ribs, chicken wings, bacon, sausage, bologna, salami, chitterlings, fatback, hot dogs, bratwurst, and packaged luncheon meats. Salted nuts and seeds. Canned beans with salt.  Dairy  Whole or 2% milk, cream, half-and-half, and cream cheese. Whole-fat or sweetened yogurt. Full-fat cheeses or blue cheese. Nondairy creamers and whipped toppings. Processed cheese, cheese spreads, or cheese  curds.  Condiments  Onion and garlic salt, seasoned salt, table salt, and sea salt. Canned and packaged gravies. Worcestershire sauce. Tartar sauce. Barbecue sauce. Teriyaki sauce. Soy sauce, including reduced sodium. Steak sauce. Fish sauce. Oyster sauce. Cocktail sauce. Horseradish. Ketchup and mustard. Meat flavorings and tenderizers. Bouillon cubes. Hot sauce. Tabasco sauce. Marinades. Taco seasonings. Relishes.  Fats and Oils  Butter, stick margarine, lard, shortening, ghee, and bacon fat. Coconut, palm kernel, or palm oils. Regular salad dressings.  Other  Pickles and olives. Salted popcorn and pretzels.  The items listed above may not be a complete list of foods and beverages to avoid. Contact your dietitian for more information.  WHERE CAN I FIND MORE INFORMATION?  National Heart, Lung, and Blood Institute: www.nhlbi.nih.gov/health/health-topics/topics/dash/     This information is not intended to replace advice given to you by your health care provider. Make sure you discuss any questions you have with your health care provider.     Document Released: 08/04/2011 Document Revised: 09/05/2014 Document Reviewed: 06/19/2013  Elsevier Interactive Patient Education ©2016 Elsevier Inc.

## 2016-08-30 ENCOUNTER — Other Ambulatory Visit: Payer: Self-pay | Admitting: Internal Medicine

## 2016-09-08 ENCOUNTER — Encounter: Payer: Self-pay | Admitting: Internal Medicine

## 2016-09-08 ENCOUNTER — Ambulatory Visit (INDEPENDENT_AMBULATORY_CARE_PROVIDER_SITE_OTHER): Payer: Medicare HMO | Admitting: Internal Medicine

## 2016-09-08 VITALS — BP 160/92 | HR 82 | Temp 98.6°F | Ht 64.0 in | Wt 186.0 lb

## 2016-09-08 DIAGNOSIS — I10 Essential (primary) hypertension: Secondary | ICD-10-CM | POA: Diagnosis not present

## 2016-09-08 DIAGNOSIS — J40 Bronchitis, not specified as acute or chronic: Secondary | ICD-10-CM | POA: Diagnosis not present

## 2016-09-08 MED ORDER — BENZONATATE 100 MG PO CAPS: 100.0000 mg | ORAL_CAPSULE | Freq: Three times a day (TID) | ORAL | 0 refills | Status: DC

## 2016-09-08 MED ORDER — LEVOFLOXACIN 500 MG PO TABS: 500.0000 mg | ORAL_TABLET | Freq: Every day | ORAL | 0 refills | Status: DC

## 2016-09-08 MED ORDER — ALBUTEROL SULFATE HFA 108 (90 BASE) MCG/ACT IN AERS: 2.0000 | INHALATION_SPRAY | Freq: Four times a day (QID) | RESPIRATORY_TRACT | 0 refills | Status: DC | PRN

## 2016-09-08 NOTE — Progress Notes (Signed)
Date:  09/08/2016   Name:  Edward Rogers Surgeon   DOB:  [birth year]   MRN:  [id number]   Chief Complaint: Cough (pt stated having cough, body ache for 1 week) Cough  This is a new problem. The current episode started in the past 7 days. The problem has been unchanged. The problem occurs every few minutes. The cough is productive of sputum. Associated symptoms include myalgias, postnasal drip, sweats and wheezing. Pertinent negatives include no chest pain, chills, ear pain, fever, headaches or shortness of breath. He has tried OTC cough suppressant for the symptoms.    Review of Systems  Constitutional: Negative for chills and fever.  HENT: Positive for congestion and postnasal drip. Negative for ear pain and sneezing.   Eyes: Negative for visual disturbance.  Respiratory: Positive for cough and wheezing. Negative for chest tightness and shortness of breath.   Cardiovascular: Negative for chest pain, palpitations and leg swelling.  Musculoskeletal: Positive for myalgias.  Neurological: Negative for dizziness and headaches.    Patient Active Problem List   Diagnosis Date Noted  . Gout 04/11/2015  . Dyslipidemia 04/11/2015  . Essential (primary) hypertension 04/11/2015  . Sarcoidosis (HCC) 04/11/2015  . Other allergic rhinitis 04/11/2015  . Tobacco use disorder, moderate, in sustained remission 04/11/2015  . Elevated PSA 04/11/2015    Prior to Admission medications   Medication Sig Start Date End Date Taking? Authorizing Provider  amLODipine (NORVASC) 10 MG tablet Take 1 tablet (10 mg total) by mouth daily. 05/06/16  Yes Reubin Milan, MD  atorvastatin (LIPITOR) 20 MG tablet Take 1 tablet (20 mg total) by mouth at bedtime. 05/06/16  Yes Reubin Milan, MD  colchicine 0.6 MG tablet TAKE ONE TABLET BY MOUTH TWICE DAILY FOR  GOUT 08/30/16  Yes Reubin Milan, MD  fluticasone [facility]) 50 MCG/ACT nasal spray Place 2 sprays into both nostrils daily as needed. 11/04/15  Yes Reubin Milan, MD  indomethacin (INDOCIN) 25 MG capsule TAKE ONE CAPSULE BY MOUTH THREE TIMES DAILY FOR GOUT 04/20/16  Yes Reubin Milan, MD  irbesartan (AVAPRO) 300 MG tablet Take 1 tablet (300 mg total) by mouth daily. 05/06/16  Yes Reubin Milan, MD  betamethasone valerate (VALISONE) 0.1 % cream Reported on 11/04/2015    Historical Provider, MD    No Known Allergies  Past Surgical History:  Procedure Laterality Date  . COLONOSCOPY  2009   normal  . PROSTATE BIOPSY  2014   normal    Social History  Substance Use Topics  . Smoking status: Former Smoker    Quit date: 08/30/2012  . Smokeless tobacco: Never Used  . Alcohol use 0.0 oz/week     Comment: rarely     Medication list has been reviewed and updated.   Physical Exam  Constitutional: He is oriented to person, place, and time. He appears well-developed. No distress.  HENT:  Head: Normocephalic and atraumatic.  Right Ear: Tympanic membrane and ear canal normal.  Left Ear: Tympanic membrane and ear canal normal.  Nose: Right sinus exhibits no maxillary sinus tenderness. Left sinus exhibits no maxillary sinus tenderness.  Mouth/Throat: No posterior oropharyngeal erythema.  Cardiovascular: Normal rate, regular rhythm and normal heart sounds.   Pulmonary/Chest: Effort normal. No respiratory distress. He has decreased breath sounds. He has wheezes in the right lower field. He has no rhonchi.  Musculoskeletal: Normal range of motion.  Neurological: He is alert and oriented to person, place, and time.  Skin: Skin is  warm and dry. No rash noted.  Psychiatric: He has a normal mood and affect. His behavior is normal. Thought content normal.  Nursing note and vitals reviewed.   BP (!) 160/92   Pulse 82   Temp 98.6 F (37 C)   Ht 5\' 4"  (1.626 m)   Wt 186 lb (84.4 kg)   SpO2 98%   BMI 31.93 kg/m   Assessment and Plan: 1. Bronchitis - levofloxacin (LEVAQUIN) 500 MG tablet; Take 1 tablet (500 mg total) by mouth daily.   Dispense: 7 tablet; Refill: 0 - benzonatate (TESSALON) 100 MG capsule; Take 1 capsule (100 mg total) by mouth 3 (three) times daily.  Dispense: 20 capsule; Refill: 0 - albuterol (PROVENTIL HFA;VENTOLIN HFA) 108 (90 Base) MCG/ACT inhaler; Inhale 2 puffs into the lungs every 6 (six) hours as needed for wheezing or shortness of breath.  Dispense: 1 Inhaler; Refill: 0  2. Essential (primary) hypertension Continue current medications May adjust at next visit    Bari EdwardLaura Jamaiya Tunnell, MD [facility]  09/08/2016

## 2016-11-04 ENCOUNTER — Encounter: Payer: Self-pay | Admitting: Internal Medicine

## 2016-11-30 ENCOUNTER — Encounter: Payer: Self-pay | Admitting: Internal Medicine

## 2016-11-30 ENCOUNTER — Ambulatory Visit (INDEPENDENT_AMBULATORY_CARE_PROVIDER_SITE_OTHER): Payer: Medicare HMO | Admitting: Internal Medicine

## 2016-11-30 VITALS — BP 136/72 | HR 70 | Ht 64.0 in | Wt 190.2 lb

## 2016-11-30 DIAGNOSIS — R69 Illness, unspecified: Secondary | ICD-10-CM | POA: Diagnosis not present

## 2016-11-30 DIAGNOSIS — M5442 Lumbago with sciatica, left side: Secondary | ICD-10-CM

## 2016-11-30 DIAGNOSIS — E785 Hyperlipidemia, unspecified: Secondary | ICD-10-CM

## 2016-11-30 DIAGNOSIS — I10 Essential (primary) hypertension: Secondary | ICD-10-CM

## 2016-11-30 DIAGNOSIS — G8929 Other chronic pain: Secondary | ICD-10-CM | POA: Diagnosis not present

## 2016-11-30 DIAGNOSIS — R972 Elevated prostate specific antigen [PSA]: Secondary | ICD-10-CM | POA: Diagnosis not present

## 2016-11-30 DIAGNOSIS — Z0000 Encounter for general adult medical examination without abnormal findings: Secondary | ICD-10-CM | POA: Diagnosis not present

## 2016-11-30 DIAGNOSIS — F17201 Nicotine dependence, unspecified, in remission: Secondary | ICD-10-CM | POA: Diagnosis not present

## 2016-11-30 LAB — POCT URINALYSIS DIPSTICK
Bilirubin, UA: NEGATIVE
Blood, UA: NEGATIVE
Glucose, UA: NEGATIVE
Ketones, UA: NEGATIVE
LEUKOCYTES UA: NEGATIVE
NITRITE UA: NEGATIVE
PH UA: 5 (ref 5.0–8.0)
PROTEIN UA: NEGATIVE
Spec Grav, UA: 1.01 (ref 1.030–1.035)
UROBILINOGEN UA: 0.2 (ref ?–2.0)

## 2016-11-30 MED ORDER — BACLOFEN 10 MG PO TABS: 10.0000 mg | ORAL_TABLET | Freq: Every day | ORAL | 2 refills | Status: DC

## 2016-11-30 NOTE — Progress Notes (Signed)
Patient: Edward Rogers, Male    DOB: [birth year], 71 y.o.   MRN: [id number] Visit Date: 11/30/2016  Today's Provider: Bari Edward, MD   Chief Complaint  Patient presents with  . Medicare Wellness  . Hypertension   Subjective:    Annual wellness visit Edward Rogers is a 71 y.o. male who presents today for his Subsequent Annual Wellness Visit. He feels well. He reports exercising some. He reports he is sleeping fairly well.   ----------------------------------------------------------- Hypertension  This is a chronic problem. The problem is unchanged. The problem is controlled. Associated symptoms include shortness of breath. Pertinent negatives include no chest pain, headaches or palpitations.  Hyperlipidemia  This is a chronic problem. The problem is controlled. Associated symptoms include shortness of breath. Pertinent negatives include no chest pain or myalgias. Current antihyperlipidemic treatment includes statins. The current treatment provides significant improvement of lipids.   Sciatica - of the left side with radiation down lateral leg.  Mild pain but no weakness.  Not taking any medications.  He has never had xrays or seen a specialist.  No injury that he is aware of.   It mostly keeps him awake at night - tossing and turning to get comfortable.  Gout - having mild intermittent foot sx.  These respond well to several days of colchicine.  Elevated PSA - seen by Urology. Bx negative,  Has not been back for follow up.  Review of Systems  Constitutional: Negative for appetite change, chills, diaphoresis, fatigue and unexpected weight change.  HENT: Negative for hearing loss, tinnitus, trouble swallowing and voice change.   Eyes: Negative for visual disturbance.  Respiratory: Positive for shortness of breath. Negative for choking and wheezing.   Cardiovascular: Negative for chest pain, palpitations and leg swelling.  Gastrointestinal: Negative for abdominal pain,  blood in stool, constipation and diarrhea.  Genitourinary: Negative for difficulty urinating, dysuria, frequency and hematuria.  Musculoskeletal: Positive for back pain. Negative for arthralgias and myalgias.  Skin: Negative for color change and rash.  Allergic/Immunologic: Positive for environmental allergies.  Neurological: Positive for numbness (left sided sciatica). Negative for dizziness, syncope and headaches.  Hematological: Negative for adenopathy.  Psychiatric/Behavioral: Negative for dysphoric mood and sleep disturbance.    Social History   Social History  . Marital status: Single    Spouse name: N/A  . Number of children: N/A  . Years of education: N/A   Occupational History  . Not on file.   Social History Main Topics  . Smoking status: Former Smoker    Quit date: 08/30/2012  . Smokeless tobacco: Never Used  . Alcohol use 0.0 oz/week     Comment: rarely  . Drug use: No  . Sexual activity: Not on file   Other Topics Concern  . Not on file   Social History Narrative  . No narrative on file    Patient Active Problem List   Diagnosis Date Noted  . Gout 04/11/2015  . Dyslipidemia 04/11/2015  . Essential (primary) hypertension 04/11/2015  . Sarcoidosis (HCC) 04/11/2015  . Other allergic rhinitis 04/11/2015  . Tobacco use disorder, moderate, in sustained remission 04/11/2015  . Elevated PSA 04/11/2015    Past Surgical History:  Procedure Laterality Date  . COLONOSCOPY  2009   normal  . PROSTATE BIOPSY  2014   normal    His family history includes Cancer in his mother; Heart failure in his father.     Previous Medications   AMLODIPINE (NORVASC) 10 MG TABLET  Take 1 tablet (10 mg total) by mouth daily.   ATORVASTATIN (LIPITOR) 20 MG TABLET    Take 1 tablet (20 mg total) by mouth at bedtime.   BETAMETHASONE VALERATE (VALISONE) 0.1 % CREAM    Reported on 11/04/2015   COLCHICINE 0.6 MG TABLET    TAKE ONE TABLET BY MOUTH TWICE DAILY FOR  GOUT   FLUTICASONE  (FLONASE) 50 MCG/ACT NASAL SPRAY    Place 2 sprays into both nostrils daily as needed.   INDOMETHACIN (INDOCIN) 25 MG CAPSULE    TAKE ONE CAPSULE BY MOUTH THREE TIMES DAILY FOR GOUT   IRBESARTAN (AVAPRO) 300 MG TABLET    Take 1 tablet (300 mg total) by mouth daily.   VENTOLIN HFA 108 (90 BASE) MCG/ACT INHALER        Patient Care Team: Reubin Milan, MD as PCP - General (Family Medicine) Harle Battiest, PA-C as Physician Assistant (Urology)      Objective:   Vitals: BP 136/72 (BP Location: Right Arm, Patient Position: Sitting, Cuff Size: Large)   Pulse 70   Ht  (1.626 m)   Wt 198 lb (89.8 kg)   SpO2 97%   BMI 33.99 kg/m   Physical Exam  Constitutional: He is oriented to person, place, and time. He appears well-developed and well-nourished.  HENT:  Head: Normocephalic.  Right Ear: Tympanic membrane, external ear and ear canal normal.  Left Ear: Tympanic membrane, external ear and ear canal normal.  Nose: Nose normal.  Mouth/Throat: Uvula is midline and oropharynx is clear and moist.  Eyes: Conjunctivae and EOM are normal. Pupils are equal, round, and reactive to light.  Neck: Normal range of motion. Neck supple. Carotid bruit is not present. No thyromegaly present.  Cardiovascular: Normal rate, regular rhythm, normal heart sounds and intact distal pulses.   Pulmonary/Chest: Effort normal and breath sounds normal. He has no wheezes. Right breast exhibits no mass. Left breast exhibits no mass.  Abdominal: Soft. Normal appearance and bowel sounds are normal. There is no hepatosplenomegaly. There is no tenderness.  Musculoskeletal: Normal range of motion.  Lymphadenopathy:    He has no cervical adenopathy.  Neurological: He is alert and oriented to person, place, and time. He has normal reflexes.  Skin: Skin is warm, dry and intact.  Psychiatric: He has a normal mood and affect. His speech is normal and behavior is normal. Judgment and thought content normal.  Nursing  note and vitals reviewed.   Activities of Daily Living In your present state of health, do you have any difficulty performing the following activities: 11/30/2016  Hearing? N  Vision? N  Difficulty concentrating or making decisions? N  Walking or climbing stairs? N  Dressing or bathing? N  Doing errands, shopping? N  Preparing Food and eating ? N  Using the Toilet? N  In the past six months, have you accidently leaked urine? N  Do you have problems with loss of bowel control? N  Managing your Medications? N  Managing your Finances? N  Housekeeping or managing your Housekeeping? N  Some recent data might be hidden    Fall Risk Assessment Fall Risk  11/30/2016 11/04/2015  Falls in the past year? No No      Depression Screen PHQ 2/9 Scores 11/30/2016 11/04/2015  PHQ - 2 Score 0 0   6CIT Screen 11/30/2016  What Year? 0 points  What month? 0 points  What time? 0 points  Count back from 20 0 points  Months in  reverse 0 points  Repeat phrase 2 points  Total Score 2    Medicare Annual Wellness Visit Summary:  Reviewed patient's Family Medical History Reviewed and updated list of patient's medical providers Assessment of cognitive impairment was done Assessed patient's functional ability Established a written schedule for health screening services Health Risk Assessent Completed and Reviewed  Exercise Activities and Dietary recommendations Goals    None      Immunization History  Administered Date(s) Administered  . Pneumococcal Conjugate-13 11/04/2015  . Pneumococcal Polysaccharide-23 08/31/2007    Health Maintenance  Topic Date Due  . Hepatitis C Screening  September 03, 1945  . TETANUS/TDAP  12/29/1962  . PNA vac Low Risk Adult (2 of 2 - PPSV23) 11/03/2016  . INFLUENZA VACCINE  03/29/2017  . COLONOSCOPY  12/16/2018    Discussed health benefits of physical activity, and encouraged him to engage in regular exercise appropriate for his age and condition.      ------------------------------------------------------------------------------------------------------------  Assessment & Plan:   1. Medicare annual wellness visit, subsequent Measures satisfied - POCT urinalysis dipstick  2. Essential (primary) hypertension controlled - CBC with Differential/Platelet - Comprehensive metabolic panel  3. Elevated PSA Seen by Urology - bx negative - PSA  4. Dyslipidemia Continue statin therapy - Lipid panel  5. Tobacco use disorder, moderate, in sustained remission  6. Chronic left-sided low back pain with left-sided sciatica Begin Baclofen at HS with Ibuprofen 400 mg   Meds ordered this encounter  Medications  . baclofen (LIORESAL) 10 MG tablet    Sig: Take 1 tablet (10 mg total) by mouth at bedtime.    Dispense:  30 each    Refill:  2    Bari Edward, MD [facility]  11/30/2016

## 2016-11-30 NOTE — Patient Instructions (Signed)
Health Maintenance  Topic Date Due  . TETANUS/TDAP  12/29/1962  . PNA vac Low Risk Adult (2 of 2 - PPSV23) 11/03/2016  . Hepatitis C Screening  11/30/2017 (Originally 10/28/1945)  . INFLUENZA VACCINE  03/29/2017  . COLONOSCOPY  12/16/2018

## 2016-12-01 LAB — CBC WITH DIFFERENTIAL/PLATELET
Basophils Absolute: 0.1 10*3/uL (ref 0.0–0.2)
Basos: 1 %
EOS (ABSOLUTE): 0.5 10*3/uL — ABNORMAL HIGH (ref 0.0–0.4)
EOS: 13 %
HEMATOCRIT: 42.3 % (ref 37.5–51.0)
HEMOGLOBIN: 13.6 g/dL (ref 13.0–17.7)
IMMATURE GRANULOCYTES: 0 %
Immature Grans (Abs): 0 10*3/uL (ref 0.0–0.1)
LYMPHS ABS: 1.1 10*3/uL (ref 0.7–3.1)
Lymphs: 30 %
MCH: 26.4 pg — ABNORMAL LOW (ref 26.6–33.0)
MCHC: 32.2 g/dL (ref 31.5–35.7)
MCV: 82 fL (ref 79–97)
MONOCYTES: 15 %
Monocytes Absolute: 0.6 10*3/uL (ref 0.1–0.9)
NEUTROS PCT: 41 %
Neutrophils Absolute: 1.5 10*3/uL (ref 1.4–7.0)
Platelets: 261 10*3/uL (ref 150–379)
RBC: 5.16 x10E6/uL (ref 4.14–5.80)
RDW: 15.1 % (ref 12.3–15.4)
WBC: 3.8 10*3/uL (ref 3.4–10.8)

## 2016-12-01 LAB — COMPREHENSIVE METABOLIC PANEL
ALBUMIN: 4.2 g/dL (ref 3.5–4.8)
ALT: 19 IU/L (ref 0–44)
AST: 16 IU/L (ref 0–40)
Albumin/Globulin Ratio: 1.4 (ref 1.2–2.2)
Alkaline Phosphatase: 92 IU/L (ref 39–117)
BUN / CREAT RATIO: 11 (ref 10–24)
BUN: 13 mg/dL (ref 8–27)
Bilirubin Total: 0.3 mg/dL (ref 0.0–1.2)
CALCIUM: 9.2 mg/dL (ref 8.6–10.2)
CO2: 24 mmol/L (ref 18–29)
CREATININE: 1.17 mg/dL (ref 0.76–1.27)
Chloride: 104 mmol/L (ref 96–106)
GFR calc Af Amer: 72 mL/min/{1.73_m2} (ref >59)
GFR, EST NON AFRICAN AMERICAN: 62 mL/min/{1.73_m2} (ref >59)
GLOBULIN, TOTAL: 3.1 g/dL (ref 1.5–4.5)
Glucose: 83 mg/dL (ref 65–99)
Potassium: 3.9 mmol/L (ref 3.5–5.2)
SODIUM: 145 mmol/L — AB (ref 134–144)
Total Protein: 7.3 g/dL (ref 6.0–8.5)

## 2016-12-01 LAB — LIPID PANEL
CHOL/HDL RATIO: 6.6 ratio — AB (ref 0.0–5.0)
Cholesterol, Total: 185 mg/dL (ref 100–199)
HDL: 28 mg/dL — ABNORMAL LOW (ref >39)
LDL Calculated: 97 mg/dL (ref 0–99)
Triglycerides: 301 mg/dL — ABNORMAL HIGH (ref 0–149)
VLDL Cholesterol Cal: 60 mg/dL — ABNORMAL HIGH (ref 5–40)

## 2016-12-01 LAB — PSA: Prostate Specific Ag, Serum: 0.5 ng/mL (ref 0.0–4.0)

## 2017-02-01 ENCOUNTER — Other Ambulatory Visit: Payer: Self-pay | Admitting: Internal Medicine

## 2017-04-19 ENCOUNTER — Other Ambulatory Visit: Payer: Self-pay | Admitting: Internal Medicine

## 2017-04-19 ENCOUNTER — Telehealth: Payer: Self-pay

## 2017-04-19 MED ORDER — COLCHICINE 0.6 MG PO TABS: 0.6000 mg | ORAL_TABLET | Freq: Two times a day (BID) | ORAL | 0 refills | Status: DC | PRN

## 2017-04-19 NOTE — Telephone Encounter (Signed)
Patient called stating he needs refill on Indomethacin medication. [facility] in [place]. Please Advise.

## 2017-04-21 ENCOUNTER — Other Ambulatory Visit: Payer: Self-pay

## 2017-04-21 MED ORDER — INDOMETHACIN 25 MG PO CAPS: ORAL_CAPSULE | ORAL | 0 refills | Status: DC

## 2017-04-21 NOTE — Telephone Encounter (Signed)
Sent refill for indomethacin but Prior Auth required. They will want Uric Acid level and patient has not had labs since 10/2015 for Uric Acid. Will request patient come in for OV to order labs and discuss Gout meds.

## 2017-06-01 ENCOUNTER — Ambulatory Visit (INDEPENDENT_AMBULATORY_CARE_PROVIDER_SITE_OTHER): Payer: Medicare HMO | Admitting: Internal Medicine

## 2017-06-01 ENCOUNTER — Encounter: Payer: Self-pay | Admitting: Internal Medicine

## 2017-06-01 VITALS — BP 134/84 | HR 82 | Ht 64.0 in | Wt 194.0 lb

## 2017-06-01 DIAGNOSIS — M5442 Lumbago with sciatica, left side: Secondary | ICD-10-CM | POA: Diagnosis not present

## 2017-06-01 DIAGNOSIS — M1000 Idiopathic gout, unspecified site: Secondary | ICD-10-CM | POA: Diagnosis not present

## 2017-06-01 DIAGNOSIS — G8929 Other chronic pain: Secondary | ICD-10-CM | POA: Diagnosis not present

## 2017-06-01 DIAGNOSIS — M19079 Primary osteoarthritis, unspecified ankle and foot: Secondary | ICD-10-CM | POA: Diagnosis not present

## 2017-06-01 DIAGNOSIS — I10 Essential (primary) hypertension: Secondary | ICD-10-CM | POA: Diagnosis not present

## 2017-06-01 MED ORDER — MELOXICAM 15 MG PO TABS: 15.0000 mg | ORAL_TABLET | Freq: Every day | ORAL | 5 refills | Status: DC

## 2017-06-01 NOTE — Progress Notes (Signed)
Date:  06/01/2017   Name:  Edward Rogers   DOB:  02/19/1946   MRN:  [id number]   Chief Complaint: Hypertension Hypertension  This is a chronic problem. The problem is unchanged. The problem is controlled. Pertinent negatives include no chest pain, palpitations or shortness of breath. The current treatment provides significant improvement.  Hyperlipidemia  This is a chronic problem. The problem is controlled. Pertinent negatives include no chest pain or shortness of breath. Current antihyperlipidemic treatment includes statins.  Ankle Pain   There was no injury mechanism. The pain is present in the right ankle. The quality of the pain is described as aching. The pain is mild. The pain has been fluctuating since onset. Pertinent negatives include no numbness or tingling. He has tried NSAIDs for the symptoms. The treatment provided mild relief.  He also has gout intermittently in his right great toe. When he tries to walk especially uphill seemed to trigger a gout flareup. He continues on colchicine when necessary. He's also been taking colchicine for his ankle pain which I have discouraged.    Review of Systems  Constitutional: Negative for chills, fatigue and fever.  Respiratory: Negative for chest tightness, shortness of breath and wheezing.   Cardiovascular: Negative for chest pain, palpitations and leg swelling.  Gastrointestinal: Negative for abdominal pain and blood in stool.  Musculoskeletal: Positive for arthralgias, gait problem and joint swelling (right great toe at times).  Neurological: Negative for tingling and numbness.  Psychiatric/Behavioral: Negative for dysphoric mood and sleep disturbance.    Patient Active Problem List   Diagnosis Date Noted  . 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 Chronic left-sided low back pain with left-sided sciatica 11/30/2016  . Gout 04/11/2015  . Dyslipidemia 04/11/2015  . Essential (primary) hypertension 04/11/2015  . Sarcoidosis  04/11/2015  . Other allergic rhinitis 04/11/2015  . Tobacco use disorder, moderate, in sustained remission 04/11/2015  . Elevated PSA 04/11/2015    Prior to Admission medications   Medication Sig Start Date End Date Taking? Authorizing Provider  amLODipine (NORVASC) 10 MG tablet Take 1 tablet (10 mg total) by mouth daily. 05/06/16  Yes Reubin Milan, MD  atorvastatin (LIPITOR) 20 MG tablet Take 1 tablet (20 mg total) by mouth at bedtime. 05/06/16  Yes Reubin Milan, MD  baclofen (LIORESAL) 10 MG tablet Take 1 tablet (10 mg total) by mouth at bedtime. 11/30/16  Yes Reubin Milan, MD  betamethasone valerate (VALISONE) 0.1 % cream Reported on 11/04/2015   Yes [provider]  colchicine 0.6 MG tablet Take 1 tablet (0.6 mg total) by mouth 2 (two) times daily as needed. 04/19/17  Yes Reubin Milan, MD  fluticasone [facility]) 50 MCG/ACT nasal spray USE TWO SPRAY(S) IN EACH NOSTRIL ONCE DAILY AS NEEDED 02/01/17  Yes Reubin Milan, MD  indomethacin (INDOCIN) 25 MG capsule TAKE ONE CAPSULE BY MOUTH THREE TIMES DAILY FOR GOUT 04/21/17  Yes Reubin Milan, MD  VENTOLIN HFA 108 [phone] Base) MCG/ACT inhaler  09/08/16  Yes [provider]    No Known Allergies  Past Surgical History:  Procedure Laterality Date  . COLONOSCOPY  2009   normal  . PROSTATE BIOPSY  2014   normal    Social History  Substance Use Topics  . Smoking status: Former Smoker    Quit date: 08/30/2012  . Smokeless tobacco: Never Used  . Alcohol use 0.0 oz/week     Comment: rarely     Medication list  has been reviewed and updated.  PHQ 2/9 Scores 11/30/2016 11/04/2015  PHQ - 2 Score 0 0    Physical Exam  Constitutional: He is oriented to person, place, and time. He appears well-developed. No distress.  HENT:  Head: Normocephalic and atraumatic.  Cardiovascular: Normal rate, regular rhythm and normal heart sounds.   Pulmonary/Chest: Effort normal and breath sounds normal. No respiratory distress.  He has no wheezes.  Musculoskeletal: He exhibits no edema.       Right ankle: He exhibits decreased range of motion (with mild tenderness; no redness, warmth or swelling).       Feet:  Neurological: He is alert and oriented to person, place, and time.  Skin: Skin is warm and dry. No rash noted.  Psychiatric: He has a normal mood and affect. His behavior is normal. Thought content normal.  Nursing note and vitals reviewed.   BP 134/84 (BP Location: Right Arm, Patient Position: Sitting, Cuff Size: Large)   Pulse 82   Ht  (1.626 m)   Wt 194 lb (88 kg)   SpO2 99%   BMI 33.30 kg/m   Assessment and Plan: 1. Essential (primary) hypertension controlled - Basic metabolic panel  2. Chronic left-sided low back pain with left-sided sciatica stable  3. Idiopathic gout, unspecified chronicity, unspecified site Use colchicine only PRN  4. Arthritis of joint of toe Begin mobic daily as needed Monitor renal function - meloxicam (MOBIC) 15 MG tablet; Take 1 tablet (15 mg total) by mouth daily.  Dispense: 30 tablet; Refill: 5   Meds ordered this encounter  Medications  . meloxicam (MOBIC) 15 MG tablet    Sig: Take 1 tablet (15 mg total) by mouth daily.    Dispense:  30 tablet    Refill:  5    Partially dictated using Animal nutritionist. Any errors are unintentional.  Bari Edward, MD [facility]  06/01/2017

## 2017-06-01 NOTE — Patient Instructions (Signed)
Take Meloxicam once a day as needed for arthritis pain.  Take colchicine only for gout flare ups

## 2017-06-02 LAB — BASIC METABOLIC PANEL
BUN/Creatinine Ratio: 9 — ABNORMAL LOW (ref 10–24)
BUN: 12 mg/dL (ref 8–27)
CALCIUM: 9.6 mg/dL (ref 8.6–10.2)
CHLORIDE: 105 mmol/L (ref 96–106)
CO2: 22 mmol/L (ref 20–29)
Creatinine, Ser: 1.34 mg/dL — ABNORMAL HIGH (ref 0.76–1.27)
GFR calc Af Amer: 60 mL/min/{1.73_m2} (ref >59)
GFR calc non Af Amer: 52 mL/min/{1.73_m2} — ABNORMAL LOW (ref >59)
GLUCOSE: 97 mg/dL (ref 65–99)
POTASSIUM: 4.2 mmol/L (ref 3.5–5.2)
Sodium: 142 mmol/L (ref 134–144)

## 2017-08-09 ENCOUNTER — Other Ambulatory Visit: Payer: Self-pay | Admitting: Internal Medicine

## 2017-08-09 DIAGNOSIS — E785 Hyperlipidemia, unspecified: Secondary | ICD-10-CM

## 2017-08-09 DIAGNOSIS — I10 Essential (primary) hypertension: Secondary | ICD-10-CM

## 2017-09-06 ENCOUNTER — Telehealth: Payer: Self-pay | Admitting: Internal Medicine

## 2017-11-30 ENCOUNTER — Telehealth: Payer: Self-pay

## 2017-11-30 NOTE — Telephone Encounter (Signed)
Called pt to reschedule AWV currently scheduled for 12/21/17. [facility] is not available on this day. Pt was adamant that this appt NOT be rescheduled. Advised I would make Dr. Judithann GravesBerglund aware.

## 2017-12-04 ENCOUNTER — Other Ambulatory Visit: Payer: Self-pay | Admitting: Internal Medicine

## 2017-12-21 ENCOUNTER — Ambulatory Visit: Payer: Self-pay

## 2017-12-25 ENCOUNTER — Ambulatory Visit: Payer: Self-pay

## 2017-12-28 ENCOUNTER — Encounter: Payer: Self-pay | Admitting: Internal Medicine

## 2018-01-09 DIAGNOSIS — H524 Presbyopia: Secondary | ICD-10-CM | POA: Diagnosis not present

## 2018-01-11 ENCOUNTER — Other Ambulatory Visit: Payer: Self-pay | Admitting: Internal Medicine

## 2018-01-26 ENCOUNTER — Encounter: Payer: Self-pay | Admitting: Internal Medicine

## 2018-01-26 ENCOUNTER — Ambulatory Visit (INDEPENDENT_AMBULATORY_CARE_PROVIDER_SITE_OTHER): Payer: Medicare HMO | Admitting: Internal Medicine

## 2018-01-26 VITALS — BP 192/87 | HR 113 | Resp 16 | Ht 64.0 in | Wt 194.0 lb

## 2018-01-26 DIAGNOSIS — M1000 Idiopathic gout, unspecified site: Secondary | ICD-10-CM | POA: Diagnosis not present

## 2018-01-26 DIAGNOSIS — E785 Hyperlipidemia, unspecified: Secondary | ICD-10-CM | POA: Diagnosis not present

## 2018-01-26 DIAGNOSIS — I10 Essential (primary) hypertension: Secondary | ICD-10-CM | POA: Diagnosis not present

## 2018-01-26 MED ORDER — BACLOFEN 10 MG PO TABS
10.0000 mg | ORAL_TABLET | Freq: Every evening | ORAL | 2 refills | Status: DC | PRN
Start: 2018-01-26 — End: 2019-10-09

## 2018-01-26 MED ORDER — IRBESARTAN 300 MG PO TABS: 300.0000 mg | ORAL_TABLET | Freq: Every day | ORAL | 3 refills | Status: DC

## 2018-01-26 MED ORDER — ALLOPURINOL 100 MG PO TABS: 100.0000 mg | ORAL_TABLET | Freq: Every day | ORAL | 1 refills | Status: DC

## 2018-01-26 MED ORDER — COLCHICINE 0.6 MG PO TABS: 0.6000 mg | ORAL_TABLET | Freq: Two times a day (BID) | ORAL | 1 refills | Status: DC | PRN

## 2018-01-26 MED ORDER — INDOMETHACIN 25 MG PO CAPS: ORAL_CAPSULE | ORAL | 0 refills | Status: DC

## 2018-01-26 MED ORDER — ATORVASTATIN CALCIUM 20 MG PO TABS: 20.0000 mg | ORAL_TABLET | Freq: Every day | ORAL | 3 refills | Status: DC

## 2018-01-26 MED ORDER — AMLODIPINE BESYLATE 10 MG PO TABS: 10.0000 mg | ORAL_TABLET | Freq: Every day | ORAL | 1 refills | Status: DC

## 2018-01-26 NOTE — Progress Notes (Signed)
Date:  01/26/2018   Name:  Edward Rogers   DOB:  05-08-1946   MRN:  [id number]   Chief Complaint: Hypertension; Hyperlipidemia; and Leg Pain (L and R ankles ) Hypertension  This is a chronic problem. The problem is unchanged. The problem is controlled. Pertinent negatives include no chest pain, headaches, palpitations or shortness of breath. Past treatments include calcium channel blockers. The current treatment provides significant improvement.  Hyperlipidemia  This is a chronic problem. Pertinent negatives include no chest pain or shortness of breath. Current antihyperlipidemic treatment includes statins. The current treatment provides significant improvement of lipids.  Gout - gets sx in both ankles and great toes.  He takes indomethacin and colchicine as needed. He has changed his diet but still gets some degree of inflammation and pain at least once a month.  Lab Results  Component Value Date   CREATININE 1.34 (H) 01/26/2018   BUN 12 01/26/2018   NA 143 01/26/2018   K 4.2 01/26/2018   CL 100 01/26/2018   CO2 21 01/26/2018   Lab Results  Component Value Date   CHOL 185 11/30/2016   HDL 28 (L) 11/30/2016   LDLCALC 97 11/30/2016   TRIG 301 (H) 11/30/2016   CHOLHDL 6.6 (H) 11/30/2016      Review of Systems  Constitutional: Negative for chills, fatigue and fever.  Eyes: Negative for visual disturbance.  Respiratory: Negative for chest tightness, shortness of breath and wheezing.   Cardiovascular: Negative for chest pain, palpitations and leg swelling.  Gastrointestinal: Negative for abdominal pain.  Musculoskeletal: Positive for arthralgias, gait problem and joint swelling.  Skin: Negative for rash and wound.  Neurological: Negative for dizziness, weakness and headaches.  Hematological: Negative for adenopathy.  Psychiatric/Behavioral: Negative for sleep disturbance.    Patient Active Problem List   Diagnosis Date Noted  . Arthritis of joint of toe 06/01/2017    . Chronic left-sided low back pain with left-sided sciatica 11/30/2016  . Gout 04/11/2015  . Dyslipidemia 04/11/2015  . Essential (primary) hypertension 04/11/2015  . Sarcoidosis 04/11/2015  . Other allergic rhinitis 04/11/2015  . Tobacco use disorder, moderate, in sustained remission 04/11/2015  . Elevated PSA 04/11/2015    Prior to Admission medications   Medication Sig Start Date End Date Taking? Authorizing Provider  amLODipine (NORVASC) 10 MG tablet TAKE ONE TABLET BY MOUTH ONCE DAILY 08/09/17   Reubin Milan, MD  atorvastatin (LIPITOR) 20 MG tablet TAKE ONE TABLET BY MOUTH AT BEDTIME 08/09/17   Reubin Milan, MD  baclofen (LIORESAL) 10 MG tablet Take 1 tablet (10 mg total) by mouth at bedtime. 11/30/16   Reubin Milan, MD  betamethasone valerate (VALISONE) 0.1 % cream Reported on 11/04/2015    [provider]  colchicine 0.6 MG tablet Take 1 tablet (0.6 mg total) by mouth 2 (two) times daily as needed. 04/19/17   Reubin Milan, MD  fluticasone [facility]) 50 MCG/ACT nasal spray USE TWO SPRAY(S) IN EACH NOSTRIL ONCE DAILY AS NEEDED 02/01/17   Reubin Milan, MD  fluticasone [facility]) 50 MCG/ACT nasal spray USE TWO SPRAY(S) IN EACH NOSTRIL ONCE DAILY AS NEEDED 12/04/17   Reubin Milan, MD  indomethacin (INDOCIN) 25 MG capsule TAKE ONE CAPSULE BY MOUTH THREE TIMES DAILY FOR GOUT 04/21/17   Reubin Milan, MD  meloxicam (MOBIC) 15 MG tablet Take 1 tablet (15 mg total) by mouth daily. 06/01/17   Reubin Milan, MD  VENTOLIN HFA 108 [phone] Base) MCG/ACT inhaler  09/08/16   [provider]    No Known Allergies  Past Surgical History:  Procedure Laterality Date  . COLONOSCOPY  2009   normal  . PROSTATE BIOPSY  2014   normal    Social History   Tobacco Use  . Smoking status: Former Smoker    Last attempt to quit: 08/30/2012    Years since quitting: 5.4  . Smokeless tobacco: Never Used  Substance Use Topics  . Alcohol use: Yes    Alcohol/week: 0.0  oz    Comment: rarely  . Drug use: No     Medication list has been reviewed and updated.  Current Meds  Medication Sig  . amLODipine (NORVASC) 10 MG tablet Take 1 tablet (10 mg total) by mouth daily.  Marland Kitchen atorvastatin (LIPITOR) 20 MG tablet Take 1 tablet (20 mg total) by mouth at bedtime.  . baclofen (LIORESAL) 10 MG tablet Take 1 tablet (10 mg total) by mouth at bedtime as needed for muscle spasms.  . colchicine 0.6 MG tablet Take 1 tablet (0.6 mg total) by mouth 2 (two) times daily as needed.  . fluticasone (FLONASE) 50 MCG/ACT nasal spray USE TWO SPRAY(S) IN EACH NOSTRIL ONCE DAILY AS NEEDED  . indomethacin (INDOCIN) 25 MG capsule TAKE ONE CAPSULE BY MOUTH THREE TIMES DAILY FOR GOUT  . irbesartan (AVAPRO) 300 MG tablet Take 1 tablet (300 mg total) by mouth daily.  . meloxicam (MOBIC) 15 MG tablet Take 1 tablet (15 mg total) by mouth daily.  . VENTOLIN HFA 108 (90 Base) MCG/ACT inhaler   . [DISCONTINUED] amLODipine (NORVASC) 10 MG tablet TAKE ONE TABLET BY MOUTH ONCE DAILY  . [DISCONTINUED] atorvastatin (LIPITOR) 20 MG tablet TAKE ONE TABLET BY MOUTH AT BEDTIME  . [DISCONTINUED] baclofen (LIORESAL) 10 MG tablet Take 10 mg by mouth at bedtime as needed for muscle spasms.  . [DISCONTINUED] betamethasone valerate (VALISONE) 0.1 % cream Reported on 11/04/2015  . [DISCONTINUED] colchicine 0.6 MG tablet Take 1 tablet (0.6 mg total) by mouth 2 (two) times daily as needed.  . [DISCONTINUED] indomethacin (INDOCIN) 25 MG capsule TAKE ONE CAPSULE BY MOUTH THREE TIMES DAILY FOR GOUT  . [DISCONTINUED] irbesartan (AVAPRO) 300 MG tablet Take 300 mg by mouth daily.    PHQ 2/9 Scores 01/26/2018 11/30/2016 11/04/2015  PHQ - 2 Score 0 0 0    Physical Exam  Constitutional: He is oriented to person, place, and time. He appears well-developed. No distress.  HENT:  Head: Normocephalic and atraumatic.  Neck: Normal range of motion. Neck supple. Carotid bruit is not present.  Cardiovascular: Normal rate,  regular rhythm and normal heart sounds.  Pulmonary/Chest: Effort normal and breath sounds normal. No respiratory distress.  Musculoskeletal: Normal range of motion. He exhibits no edema.  Neurological: He is alert and oriented to person, place, and time.  Skin: Skin is warm and dry. No rash noted.  Psychiatric: He has a normal mood and affect. His behavior is normal. Thought content normal.  Nursing note and vitals reviewed.   BP (!) 192/87   Pulse (!) 113   Resp 16   Ht  (1.626 m)   Wt 194 lb (88 kg)   SpO2 98%   BMI 33.30 kg/m    Assessment and Plan: 1. Essential (primary) hypertension Elevated today due to pt being upset after being told he was late - amLODipine (NORVASC) 10 MG tablet; Take 1 tablet (10 mg total) by mouth daily.  Dispense: 90 tablet; Refill: 1 - irbesartan (AVAPRO)  300 MG tablet; Take 1 tablet (300 mg total) by mouth daily.  Dispense: 90 tablet; Refill: 3  2. Dyslipidemia Continue statin therapy - atorvastatin (LIPITOR) 20 MG tablet; Take 1 tablet (20 mg total) by mouth at bedtime.  Dispense: 90 tablet; Refill: 3  3. Idiopathic gout, unspecified chronicity, unspecified site Frequent flares on multiple medications Will begin Allopurinol - Uric acid - Basic metabolic panel - indomethacin (INDOCIN) 25 MG capsule; TAKE ONE CAPSULE BY MOUTH THREE TIMES DAILY FOR GOUT  Dispense: 90 capsule; Refill: 0 - allopurinol (ZYLOPRIM) 100 MG tablet; Take 1 tablet (100 mg total) by mouth daily.  Dispense: 90 tablet; Refill: 1 - colchicine 0.6 MG tablet; Take 1 tablet (0.6 mg total) by mouth 2 (two) times daily as needed.  Dispense: 180 tablet; Refill: 1   Meds ordered this encounter  Medications  . amLODipine (NORVASC) 10 MG tablet    Sig: Take 1 tablet (10 mg total) by mouth daily.    Dispense:  90 tablet    Refill:  1  . atorvastatin (LIPITOR) 20 MG tablet    Sig: Take 1 tablet (20 mg total) by mouth at bedtime.    Dispense:  90 tablet    Refill:  3  .  irbesartan (AVAPRO) 300 MG tablet    Sig: Take 1 tablet (300 mg total) by mouth daily.    Dispense:  90 tablet    Refill:  3  . baclofen (LIORESAL) 10 MG tablet    Sig: Take 1 tablet (10 mg total) by mouth at bedtime as needed for muscle spasms.    Dispense:  30 each    Refill:  2  . indomethacin (INDOCIN) 25 MG capsule    Sig: TAKE ONE CAPSULE BY MOUTH THREE TIMES DAILY FOR GOUT    Dispense:  90 capsule    Refill:  0  . allopurinol (ZYLOPRIM) 100 MG tablet    Sig: Take 1 tablet (100 mg total) by mouth daily.    Dispense:  90 tablet    Refill:  1  . colchicine 0.6 MG tablet    Sig: Take 1 tablet (0.6 mg total) by mouth 2 (two) times daily as needed.    Dispense:  180 tablet    Refill:  1    Please consider 90 day supplies to promote better adherence    Partially dictated using Animal nutritionist. Any errors are unintentional.  Bari Edward, MD [facility]  01/27/2018   There are no diagnoses linked to this encounter.

## 2018-01-27 LAB — BASIC METABOLIC PANEL
BUN/Creatinine Ratio: 9 — ABNORMAL LOW (ref 10–24)
BUN: 12 mg/dL (ref 8–27)
CALCIUM: 9.5 mg/dL (ref 8.6–10.2)
CHLORIDE: 100 mmol/L (ref 96–106)
CO2: 21 mmol/L (ref 20–29)
Creatinine, Ser: 1.34 mg/dL — ABNORMAL HIGH (ref 0.76–1.27)
GFR calc Af Amer: 60 mL/min/{1.73_m2} (ref >59)
GFR, EST NON AFRICAN AMERICAN: 52 mL/min/{1.73_m2} — AB (ref >59)
GLUCOSE: 98 mg/dL (ref 65–99)
POTASSIUM: 4.2 mmol/L (ref 3.5–5.2)
SODIUM: 143 mmol/L (ref 134–144)

## 2018-01-27 LAB — URIC ACID: URIC ACID: 9.1 mg/dL — AB (ref 3.7–8.6)

## 2018-06-13 ENCOUNTER — Encounter: Payer: Self-pay | Admitting: Internal Medicine

## 2018-06-13 ENCOUNTER — Ambulatory Visit (INDEPENDENT_AMBULATORY_CARE_PROVIDER_SITE_OTHER): Payer: Medicare HMO | Admitting: Internal Medicine

## 2018-06-13 VITALS — BP 152/82 | HR 84 | Ht 64.0 in | Wt 200.2 lb

## 2018-06-13 DIAGNOSIS — Z0000 Encounter for general adult medical examination without abnormal findings: Secondary | ICD-10-CM

## 2018-06-13 DIAGNOSIS — R972 Elevated prostate specific antigen [PSA]: Secondary | ICD-10-CM | POA: Diagnosis not present

## 2018-06-13 DIAGNOSIS — E785 Hyperlipidemia, unspecified: Secondary | ICD-10-CM | POA: Diagnosis not present

## 2018-06-13 DIAGNOSIS — I10 Essential (primary) hypertension: Secondary | ICD-10-CM

## 2018-06-13 DIAGNOSIS — Z1159 Encounter for screening for other viral diseases: Secondary | ICD-10-CM

## 2018-06-13 DIAGNOSIS — F17201 Nicotine dependence, unspecified, in remission: Secondary | ICD-10-CM

## 2018-06-13 DIAGNOSIS — R69 Illness, unspecified: Secondary | ICD-10-CM | POA: Diagnosis not present

## 2018-06-13 DIAGNOSIS — Z23 Encounter for immunization: Secondary | ICD-10-CM | POA: Diagnosis not present

## 2018-06-13 DIAGNOSIS — M1000 Idiopathic gout, unspecified site: Secondary | ICD-10-CM | POA: Diagnosis not present

## 2018-06-13 MED ORDER — METOPROLOL SUCCINATE ER 50 MG PO TB24: 50.0000 mg | ORAL_TABLET | Freq: Every day | ORAL | 3 refills | Status: DC

## 2018-06-13 MED ORDER — ALLOPURINOL 100 MG PO TABS: 200.0000 mg | ORAL_TABLET | Freq: Every day | ORAL | 1 refills | Status: DC

## 2018-06-13 MED ORDER — AMLODIPINE BESYLATE 10 MG PO TABS: 10.0000 mg | ORAL_TABLET | Freq: Every day | ORAL | 1 refills | Status: DC

## 2018-06-13 NOTE — Progress Notes (Signed)
Patient: Edward Rogers, Male    DOB: Apr 25, 1946, 72 y.o.   MRN: [id number] Visit Date: 06/13/2018  Today's Provider: Bari Edward, MD   Chief Complaint  Patient presents with  . Medicare Wellness   Subjective:    Annual wellness visit Edward Rogers is a 72 y.o. male who presents today for his Subsequent Annual Wellness Visit. He feels fairly well. He reports exercising some but limited due to foot pain. He reports he is sleeping well.  He has declined MAW with [facility]. He is due for pneumonia vaccine - second PPV23 He declines influenza vaccine. ----------------------------------------------------------- Hypertension  This is a chronic problem. The problem is uncontrolled. Pertinent negatives include no chest pain, headaches, palpitations or shortness of breath. Past treatments include calcium channel blockers and angiotensin blockers. Compliance problems: had to stop HCTZ due to gout.   Hyperlipidemia  This is a chronic problem. Pertinent negatives include no chest pain, myalgias or shortness of breath. Current antihyperlipidemic treatment includes statins. The current treatment provides significant improvement of lipids.  Gout - intermittent flares, but had in elbow last month. Taking colchicine as needed, also allopurinol 100 mg per day.  Last Uric acid > 9. Elevated PSA - no longer seeing urology.  Has not had recheck in 18 months.  No urinary issues, nocturia, hematuria.  Review of Systems  Constitutional: Negative for appetite change, chills, diaphoresis, fatigue and unexpected weight change.  HENT: Negative for hearing loss, tinnitus, trouble swallowing and voice change.   Eyes: Negative for visual disturbance.  Respiratory: Negative for choking, shortness of breath and wheezing.   Cardiovascular: Negative for chest pain, palpitations and leg swelling.  Gastrointestinal: Negative for abdominal pain, blood in stool, constipation and diarrhea.  Genitourinary:  Negative for difficulty urinating, dysuria, frequency, hematuria and urgency.  Musculoskeletal: Positive for arthralgias, gait problem and joint swelling. Negative for back pain and myalgias.  Skin: Negative for color change and rash.  Neurological: Negative for dizziness, syncope and headaches.  Hematological: Negative for adenopathy.  Psychiatric/Behavioral: Negative for dysphoric mood and sleep disturbance.    Social History   Socioeconomic History  . Marital status: Single    Spouse name: Not on file  . Number of children: Not on file  . Years of education: Not on file  . Highest education level: Not on file  Occupational History  . Not on file  Social Needs  . Financial resource strain: Not on file  . Food insecurity:    Worry: Not on file    Inability: Not on file  . Transportation needs:    Medical: Not on file    Non-medical: Not on file  Tobacco Use  . Smoking status: Former Smoker    Last attempt to quit: 08/30/2012    Years since quitting: 5.7  . Smokeless tobacco: Never Used  Substance and Sexual Activity  . Alcohol use: Yes    Alcohol/week: 0.0 standard drinks    Comment: rarely  . Drug use: No  . Sexual activity: Not on file  Lifestyle  . Physical activity:    Days per week: Not on file    Minutes per session: Not on file  . Stress: Not on file  Relationships  . Social connections:    Talks on phone: Not on file    Gets together: Not on file    Attends religious service: Not on file    Active member of club or organization: Not on file    Attends meetings of clubs  or organizations: Not on file    Relationship status: Not on file  . Intimate partner violence:    Fear of current or ex partner: Not on file    Emotionally abused: Not on file    Physically abused: Not on file    Forced sexual activity: Not on file  Other Topics Concern  . Not on file  Social History Narrative  . Not on file    Patient Active Problem List   Diagnosis Date Noted  .  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 Chronic left-sided low back pain with left-sided sciatica 11/30/2016  . Gout 04/11/2015  . Dyslipidemia 04/11/2015  . Essential (primary) hypertension 04/11/2015  . Sarcoidosis 04/11/2015  . Other allergic rhinitis 04/11/2015  . Tobacco use disorder, moderate, in sustained remission 04/11/2015  . Elevated PSA 04/11/2015    Past Surgical History:  Procedure Laterality Date  . COLONOSCOPY  2009   normal  . PROSTATE BIOPSY  2014   normal    His family history includes Cancer in his mother; Heart failure in his father.     Current Meds  Medication Sig  . allopurinol (ZYLOPRIM) 100 MG tablet Take 1 tablet (100 mg total) by mouth daily.  Marland Kitchen amLODipine (NORVASC) 10 MG tablet Take 1 tablet (10 mg total) by mouth daily.  Marland Kitchen atorvastatin (LIPITOR) 20 MG tablet Take 1 tablet (20 mg total) by mouth at bedtime.  . baclofen (LIORESAL) 10 MG tablet Take 1 tablet (10 mg total) by mouth at bedtime as needed for muscle spasms.  . colchicine 0.6 MG tablet Take 1 tablet (0.6 mg total) by mouth 2 (two) times daily as needed.  . fluticasone (FLONASE) 50 MCG/ACT nasal spray USE TWO SPRAY(S) IN EACH NOSTRIL ONCE DAILY AS NEEDED  . indomethacin (INDOCIN) 25 MG capsule TAKE ONE CAPSULE BY MOUTH THREE TIMES DAILY FOR GOUT (Patient taking differently: 3 (three) times daily as needed. TAKE ONE CAPSULE BY MOUTH THREE TIMES DAILY FOR GOUT)  . irbesartan (AVAPRO) 300 MG tablet Take 1 tablet (300 mg total) by mouth daily.  . meloxicam (MOBIC) 15 MG tablet Take 1 tablet (15 mg total) by mouth daily.    Patient Care Team: Reubin Milan, MD as PCP - General (Family Medicine) Harle Battiest, PA-C as Physician Assistant (Urology)       Objective:   Vitals: BP (!) 152/82 (BP Location: Right Arm, Patient Position: Sitting, Cuff Size: Large)   Pulse 84   Ht 5\' 4"  (1.626 m)   Wt 200 lb 3.2 oz (90.8 kg)   SpO2 100%   BMI 34.36 kg/m   Physical Exam    Constitutional: He is oriented to person, place, and time. He appears well-developed and well-nourished.  HENT:  Head: Normocephalic.  Right Ear: Tympanic membrane, external ear and ear canal normal.  Left Ear: Tympanic membrane, external ear and ear canal normal.  Nose: Nose normal.  Mouth/Throat: Uvula is midline and oropharynx is clear and moist.  Eyes: Pupils are equal, round, and reactive to light. Conjunctivae and EOM are normal.  Neck: Normal range of motion. Neck supple. Carotid bruit is not present. No thyromegaly present.  Cardiovascular: Normal rate, regular rhythm, normal heart sounds and intact distal pulses.  Pulmonary/Chest: Effort normal and breath sounds normal. He has no wheezes. Right breast exhibits no mass. Left breast exhibits no mass.  Abdominal: Soft. Normal appearance and bowel sounds are normal. There is no hepatosplenomegaly. There is no tenderness.  Musculoskeletal: He exhibits  tenderness. He exhibits no edema.  Right great toe warm, swollen  Left elbow normal  Lymphadenopathy:    He has no cervical adenopathy.  Neurological: He is alert and oriented to person, place, and time. He has normal reflexes.  Skin: Skin is warm, dry and intact.  Psychiatric: He has a normal mood and affect. His speech is normal and behavior is normal. Judgment and thought content normal.  Nursing note and vitals reviewed.   Activities of Daily Living In your present state of health, do you have any difficulty performing the following activities: 06/13/2018 01/26/2018  Hearing? N N  Vision? N N  Difficulty concentrating or making decisions? N N  Walking or climbing stairs? N N  Dressing or bathing? N N  Doing errands, shopping? N N  Preparing Food and eating ? N -  Using the Toilet? N -  In the past six months, have you accidently leaked urine? N -  Do you have problems with loss of bowel control? N -  Managing your Medications? N -  Managing your Finances? N -  Housekeeping  or managing your Housekeeping? N -  Some recent data might be hidden    Fall Risk Assessment Fall Risk  06/13/2018 01/26/2018 11/30/2016 11/04/2015  Falls in the past year? No No No No     Depression Screen PHQ 2/9 Scores 06/13/2018 01/26/2018 11/30/2016 11/04/2015  PHQ - 2 Score 0 0 0 0    6CIT Screen 06/13/2018 11/30/2016  What Year? 0 points 0 points  What month? 0 points 0 points  What time? 0 points 0 points  Count back from 20 0 points 0 points  Months in reverse 0 points 0 points  Repeat phrase 2 points 2 points  Total Score 2 2    Medicare Annual Wellness Visit Summary:  Reviewed patient's Family Medical History Reviewed and updated list of patient's medical providers Assessment of cognitive impairment was done Assessed patient's functional ability Established a written schedule for health screening services Health Risk Assessent Completed and Reviewed  Exercise Activities and Dietary recommendations Goals   None     Immunization History  Administered Date(s) Administered  . Pneumococcal Conjugate-13 11/04/2015  . Pneumococcal Polysaccharide-23 08/31/2007  . Zoster 02/11/2008    Health Maintenance  Topic Date Due  . Hepatitis C Screening  04-12-1946  . TETANUS/TDAP  12/29/1962  . PNA vac Low Risk Adult (2 of 2 - PPSV23) 11/03/2016  . INFLUENZA VACCINE  06/27/2018 (Originally 03/29/2018)  . COLONOSCOPY  12/16/2018    Discussed health benefits of physical activity, and encouraged him to engage in regular exercise appropriate for his age and condition.    ------------------------------------------------------------------------------------------------------------  Assessment & Plan:  1. Medicare annual wellness visit, subsequent Measures satisfied  2. Essential (primary) hypertension Not controlled, add toprol - metoprolol succinate (TOPROL-XL) 50 MG 24 hr tablet; Take 1 tablet (50 mg total) by mouth daily. Take with or immediately following a meal.  Dispense:  90 tablet; Refill: 3 - amLODipine (NORVASC) 10 MG tablet; Take 1 tablet (10 mg total) by mouth daily.  Dispense: 90 tablet; Refill: 1 - CBC with Differential/Platelet - Comprehensive metabolic panel  3. Dyslipidemia On statin - Lipid panel  4. Tobacco use disorder, moderate, in sustained remission Does not qualify for low dose screening CT  5. Idiopathic gout, unspecified chronicity, unspecified site Increase allopurinol dose - allopurinol (ZYLOPRIM) 100 MG tablet; Take 2 tablets (200 mg total) by mouth daily.  Dispense: 180 tablet; Refill: 1 - Uric  acid  6. Need for vaccination for pneumococcus - Pneumococcal polysaccharide vaccine 23-valent greater than or equal to 2yo subcutaneous/IM  7. Elevated PSA Not seeing Urology any longer - PSA  8. Need for hepatitis C screening test - Hepatitis C antibody   Partially dictated using Animal nutritionist. Any errors are unintentional.  Bari Edward, MD [facility]  06/13/2018

## 2018-06-13 NOTE — Patient Instructions (Addendum)
Pneumococcal Polysaccharide Vaccine: What You Need to Know 1. Why get vaccinated? Vaccination can protect older adults (and some children and younger adults) from pneumococcal disease. Pneumococcal disease is caused by bacteria that can spread from person to person through close contact. It can cause ear infections, and it can also lead to more serious infections of the:  Lungs (pneumonia),  Blood (bacteremia), and  Covering of the brain and spinal cord (meningitis). Meningitis can cause deafness and brain damage, and it can be fatal.  Anyone can get pneumococcal disease, but children under 52 years of age, people with certain medical conditions, adults over 104 years of age, and cigarette smokers are at the highest risk. About 18,000 older adults die each year from pneumococcal disease in the [place]. Treatment of pneumococcal infections with penicillin and other drugs used to be more effective. But some strains of the disease have become resistant to these drugs. This makes prevention of the disease, through vaccination, even more important. 2. Pneumococcal polysaccharide vaccine (PPSV23) Pneumococcal polysaccharide vaccine (PPSV23) protects against 23 types of pneumococcal bacteria. It will not prevent all pneumococcal disease. PPSV23 is recommended for:  All adults 31 years of age and older,  Anyone 2 through 74 years of age with certain long-term health problems,  Anyone 2 through 74 years of age with a weakened immune system,  Adults 35 through 74 years of age who smoke cigarettes or have asthma.  Most people need only one dose of PPSV. A second dose is recommended for certain high-risk groups. People 73 and older should get a dose even if they have gotten one or more doses of the vaccine before they turned 65. Your healthcare provider can give you more information about these recommendations. Most healthy adults develop protection within 2 to 3 weeks of getting the shot. 3.  Some people should not get this vaccine  Anyone who has had a life-threatening allergic reaction to PPSV should not get another dose.  Anyone who has a severe allergy to any component of PPSV should not receive it. Tell your provider if you have any severe allergies.  Anyone who is moderately or severely ill when the shot is scheduled may be asked to wait until they recover before getting the vaccine. Someone with a mild illness can usually be vaccinated.  Children less than 64 years of age should not receive this vaccine.  There is no evidence that PPSV is harmful to either a pregnant woman or to her fetus. However, as a precaution, women who need the vaccine should be vaccinated before becoming pregnant, if possible. 4. Risks of a vaccine reaction With any medicine, including vaccines, there is a chance of side effects. These are usually mild and go away on their own, but serious reactions are also possible. About half of people who get PPSV have mild side effects, such as redness or pain where the shot is given, which go away within about two days. Less than 1 out of 100 people develop a fever, muscle aches, or more severe local reactions. Problems that could happen after any vaccine:  People sometimes faint after a medical procedure, including vaccination. Sitting or lying down for about 15 minutes can help prevent fainting, and injuries caused by a fall. Tell your doctor if you feel dizzy, or have vision changes or ringing in the ears.  Some people get severe pain in the shoulder and have difficulty moving the arm where a shot was given. This happens very rarely.  Any  medication can cause a severe allergic reaction. Such reactions from a vaccine are very rare, estimated at about 1 in a million doses, and would happen within a few minutes to a few hours after the vaccination. As with any medicine, there is a very remote chance of a vaccine causing a serious injury or death. The safety of  vaccines is always being monitored. For more information, visit: http://floyd.org/ 5. What if there is a serious reaction? What should I look for? Look for anything that concerns you, such as signs of a severe allergic reaction, very high fever, or unusual behavior. Signs of a severe allergic reaction can include hives, swelling of the face and throat, difficulty breathing, a fast heartbeat, dizziness, and weakness. These would usually start a few minutes to a few hours after the vaccination. What should I do? If you think it is a severe allergic reaction or other emergency that can't wait, call 9-1-1 or get to the nearest hospital. Otherwise, call your doctor. Afterward, the reaction should be reported to the Vaccine Adverse Event Reporting System (VAERS). Your doctor might file this report, or you can do it yourself through the VAERS web site at www.vaers.LAgents.no, or by calling 1-(586) 362-1833. VAERS does not give medical advice. 6. How can I learn more?  Ask your doctor. He or she can give you the vaccine package insert or suggest other sources of information.  Call your local or state health department.  Contact the Centers for Disease Control and Prevention (CDC): ? Call (850)464-6332 (1-800-CDC-INFO) or ? Visit CDC's website at PicCapture.uy CDC Pneumococcal Polysaccharide Vaccine VIS (12/20/13) This information is not intended to replace advice given to you by your health care provider. Make sure you discuss any questions you have with your health care provider. Document Released: 06/12/2006 Document Revised: 05/05/2016 Document Reviewed: 05/05/2016 Elsevier Interactive Patient Education  2017 Elsevier Inc.    Health Maintenance  Topic Date Due  . Hepatitis C Screening  completed  . TETANUS/TDAP  12/29/1962  . PNA vac Low Risk Adult (2 of 2 - PPSV23) Completed  . INFLUENZA VACCINE  06/27/2018 (Originally 03/29/2018)  . COLONOSCOPY  12/16/2018

## 2018-06-14 LAB — COMPREHENSIVE METABOLIC PANEL
A/G RATIO: 1.2 (ref 1.2–2.2)
ALT: 27 IU/L (ref 0–44)
AST: 25 IU/L (ref 0–40)
Albumin: 4.1 g/dL (ref 3.5–4.8)
Alkaline Phosphatase: 107 IU/L (ref 39–117)
BUN/Creatinine Ratio: 14 (ref 10–24)
BUN: 18 mg/dL (ref 8–27)
Bilirubin Total: 0.3 mg/dL (ref 0.0–1.2)
CO2: 21 mmol/L (ref 20–29)
Calcium: 9.3 mg/dL (ref 8.6–10.2)
Chloride: 102 mmol/L (ref 96–106)
Creatinine, Ser: 1.29 mg/dL — ABNORMAL HIGH (ref 0.76–1.27)
GFR calc Af Amer: 63 mL/min/{1.73_m2} (ref >59)
GFR calc non Af Amer: 54 mL/min/{1.73_m2} — ABNORMAL LOW (ref >59)
GLOBULIN, TOTAL: 3.5 g/dL (ref 1.5–4.5)
Glucose: 91 mg/dL (ref 65–99)
POTASSIUM: 4 mmol/L (ref 3.5–5.2)
SODIUM: 142 mmol/L (ref 134–144)
Total Protein: 7.6 g/dL (ref 6.0–8.5)

## 2018-06-14 LAB — HEPATITIS C ANTIBODY

## 2018-06-14 LAB — CBC WITH DIFFERENTIAL/PLATELET
BASOS ABS: 0.1 10*3/uL (ref 0.0–0.2)
BASOS: 2 %
EOS (ABSOLUTE): 0.7 10*3/uL — AB (ref 0.0–0.4)
Eos: 15 %
Hematocrit: 44 % (ref 37.5–51.0)
Hemoglobin: 14.1 g/dL (ref 13.0–17.7)
Immature Grans (Abs): 0 10*3/uL (ref 0.0–0.1)
Immature Granulocytes: 0 %
Lymphocytes Absolute: 1.7 10*3/uL (ref 0.7–3.1)
Lymphs: 35 %
MCH: 25 pg — ABNORMAL LOW (ref 26.6–33.0)
MCHC: 32 g/dL (ref 31.5–35.7)
MCV: 78 fL — AB (ref 79–97)
MONOS ABS: 0.6 10*3/uL (ref 0.1–0.9)
Monocytes: 12 %
Neutrophils Absolute: 1.7 10*3/uL (ref 1.4–7.0)
Neutrophils: 36 %
PLATELETS: 235 10*3/uL (ref 150–450)
RBC: 5.64 x10E6/uL (ref 4.14–5.80)
RDW: 14.3 % (ref 12.3–15.4)
WBC: 4.7 10*3/uL (ref 3.4–10.8)

## 2018-06-14 LAB — LIPID PANEL
CHOLESTEROL TOTAL: 180 mg/dL (ref 100–199)
Chol/HDL Ratio: 7.2 ratio — ABNORMAL HIGH (ref 0.0–5.0)
HDL: 25 mg/dL — ABNORMAL LOW (ref >39)
LDL CALC: 93 mg/dL (ref 0–99)
Triglycerides: 310 mg/dL — ABNORMAL HIGH (ref 0–149)
VLDL Cholesterol Cal: 62 mg/dL — ABNORMAL HIGH (ref 5–40)

## 2018-06-14 LAB — URIC ACID: URIC ACID: 8.1 mg/dL (ref 3.7–8.6)

## 2018-06-14 LAB — PSA: Prostate Specific Ag, Serum: 0.5 ng/mL (ref 0.0–4.0)

## 2018-08-01 ENCOUNTER — Ambulatory Visit (INDEPENDENT_AMBULATORY_CARE_PROVIDER_SITE_OTHER): Payer: Medicare HMO | Admitting: Internal Medicine

## 2018-08-01 ENCOUNTER — Other Ambulatory Visit: Payer: Self-pay

## 2018-08-01 ENCOUNTER — Encounter: Payer: Self-pay | Admitting: Internal Medicine

## 2018-08-01 VITALS — BP 145/86 | HR 84 | Ht 65.0 in | Wt 200.4 lb

## 2018-08-01 DIAGNOSIS — M19079 Primary osteoarthritis, unspecified ankle and foot: Secondary | ICD-10-CM

## 2018-08-01 DIAGNOSIS — M19011 Primary osteoarthritis, right shoulder: Secondary | ICD-10-CM | POA: Insufficient documentation

## 2018-08-01 DIAGNOSIS — I10 Essential (primary) hypertension: Secondary | ICD-10-CM | POA: Diagnosis not present

## 2018-08-01 DIAGNOSIS — M1000 Idiopathic gout, unspecified site: Secondary | ICD-10-CM | POA: Diagnosis not present

## 2018-08-01 NOTE — Progress Notes (Signed)
Date:  08/01/2018   Name:  Edward Rogers   DOB:  03/05/1946   MRN:  [id number]   Chief Complaint: Hypertension and Gout  Hypertension  This is a chronic problem. The problem is resistant. Pertinent negatives include no chest pain, headaches, palpitations or shortness of breath. Past treatments include calcium channel blockers, beta blockers and angiotensin blockers (beta blocker added last visit). The current treatment provides moderate improvement.  Gout - we increased allopurinol to 2 tabs daily last visit due to high UA.  Since then he has had one attack in his ankle and then he had some pain in his right shoulder that felt like gout. The right shoulder AC joint was red and swollen but improved with colchicine and indocin.  Review of Systems  Constitutional: Negative for chills, fatigue and fever.  Respiratory: Negative for cough, chest tightness, shortness of breath and wheezing.   Cardiovascular: Negative for chest pain and palpitations.  Musculoskeletal: Positive for arthralgias and joint swelling. Negative for myalgias.  Neurological: Negative for dizziness and headaches.  Psychiatric/Behavioral: Negative for dysphoric mood and sleep disturbance.    Patient Active Problem List   Diagnosis Date Noted  . 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  . 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 Chronic left-sided low back pain with left-sided sciatica 11/30/2016  . Gout 04/11/2015  . Dyslipidemia 04/11/2015  . Essential (primary) hypertension 04/11/2015  . Sarcoidosis 04/11/2015  . Other allergic rhinitis 04/11/2015  . Tobacco use disorder, moderate, in sustained remission 04/11/2015  . Elevated PSA 04/11/2015    No Known Allergies  Past Surgical History:  Procedure Laterality Date  . COLONOSCOPY  2009   normal  . PROSTATE BIOPSY  2014   normal    Social History   Tobacco Use  . Smoking status: Former Smoker    Packs/day: 0.50    Years: 30.00   Pack years: 15.00    Types: Cigarettes    Last attempt to quit: 08/30/2012    Years since quitting: 5.9  . Smokeless tobacco: Never Used  Substance Use Topics  . Alcohol use: Yes    Alcohol/week: 0.0 standard drinks    Comment: rarely  . Drug use: No     Medication list has been reviewed and updated.  Current Meds  Medication Sig  . allopurinol (ZYLOPRIM) 100 MG tablet Take 2 tablets (200 mg total) by mouth daily.  Marland Kitchen. amLODipine (NORVASC) 10 MG tablet Take 1 tablet (10 mg total) by mouth daily.  Marland Kitchen. atorvastatin (LIPITOR) 20 MG tablet Take 1 tablet (20 mg total) by mouth at bedtime.  . baclofen (LIORESAL) 10 MG tablet Take 1 tablet (10 mg total) by mouth at bedtime as needed for muscle spasms.  . colchicine 0.6 MG tablet Take 1 tablet (0.6 mg total) by mouth 2 (two) times daily as needed.  . fluticasone (FLONASE) 50 MCG/ACT nasal spray USE TWO SPRAY(S) IN EACH NOSTRIL ONCE DAILY AS NEEDED  . indomethacin (INDOCIN) 25 MG capsule TAKE ONE CAPSULE BY MOUTH THREE TIMES DAILY FOR GOUT (Patient taking differently: 3 (three) times daily as needed. TAKE ONE CAPSULE BY MOUTH THREE TIMES DAILY FOR GOUT)  . irbesartan (AVAPRO) 300 MG tablet Take 1 tablet (300 mg total) by mouth daily.  . meloxicam (MOBIC) 15 MG tablet Take 1 tablet (15 mg total) by mouth daily.  . metoprolol succinate (TOPROL-XL) 50 MG 24 hr tablet Take 1 tablet (50 mg total) by mouth daily. Take with or immediately following  a meal.    PHQ 2/9 Scores 06/13/2018 01/26/2018 11/30/2016 11/04/2015  PHQ - 2 Score 0 0 0 0    Physical Exam  Constitutional: He is oriented to person, place, and time. He appears well-developed. No distress.  HENT:  Head: Normocephalic and atraumatic.  Neck: Normal range of motion. Neck supple.  Cardiovascular: Normal rate, regular rhythm and normal heart sounds.  Pulmonary/Chest: Effort normal and breath sounds normal. No respiratory distress.  Musculoskeletal:       Right shoulder: He exhibits decreased  range of motion, tenderness and bony tenderness. He exhibits no swelling and no effusion.  Lymphadenopathy:    He has no cervical adenopathy.  Neurological: He is alert and oriented to person, place, and time.  Skin: Skin is warm and dry. No rash noted.  Psychiatric: He has a normal mood and affect. His speech is normal and behavior is normal. Thought content normal.  Nursing note and vitals reviewed.   BP (!) 145/86 (BP Location: Right Arm, Patient Position: Sitting, Cuff Size: Normal)   Pulse 84   Ht 5\' 5"  (1.651 m)   Wt 200 lb 6.4 oz (90.9 kg)   SpO2 98%   BMI 33.35 kg/m   Assessment and Plan: 1. Essential (primary) hypertension Improved control with addition of metoprolol  2. Idiopathic gout, unspecified chronicity, unspecified site Continue current therapy Consider UA levels next visit  3. Primary osteoarthritis of right shoulder Continue indocin as needed - doubt that this is gout   Partially dictated using Animal nutritionist. Any errors are unintentional.  Bari Edward, MD [facility]  08/01/2018

## 2018-10-01 NOTE — Telephone Encounter (Signed)
closing encounter

## 2018-10-31 ENCOUNTER — Other Ambulatory Visit: Payer: Self-pay

## 2018-10-31 ENCOUNTER — Ambulatory Visit
Admission: RE | Admit: 2018-10-31 | Discharge: 2018-10-31 | Disposition: A | Discharge status: Home / Self Care | Payer: Medicare HMO | Attending: Internal Medicine | Admitting: Internal Medicine

## 2018-10-31 ENCOUNTER — Encounter: Payer: Self-pay | Admitting: Internal Medicine

## 2018-10-31 ENCOUNTER — Ambulatory Visit
Admission: RE | Admit: 2018-10-31 | Discharge: 2018-10-31 | Disposition: A | Discharge status: Home / Self Care | Payer: Medicare HMO | Source: Ambulatory Visit | Attending: Internal Medicine | Admitting: Internal Medicine

## 2018-10-31 ENCOUNTER — Ambulatory Visit (INDEPENDENT_AMBULATORY_CARE_PROVIDER_SITE_OTHER): Payer: Medicare HMO | Admitting: Internal Medicine

## 2018-10-31 VITALS — BP 132/82 | HR 76 | Ht 65.0 in | Wt 199.0 lb

## 2018-10-31 DIAGNOSIS — M1000 Idiopathic gout, unspecified site: Secondary | ICD-10-CM | POA: Diagnosis not present

## 2018-10-31 DIAGNOSIS — I10 Essential (primary) hypertension: Secondary | ICD-10-CM | POA: Diagnosis not present

## 2018-10-31 DIAGNOSIS — R0600 Dyspnea, unspecified: Secondary | ICD-10-CM

## 2018-10-31 DIAGNOSIS — R0609 Other forms of dyspnea: Secondary | ICD-10-CM | POA: Diagnosis not present

## 2018-10-31 DIAGNOSIS — R0602 Shortness of breath: Secondary | ICD-10-CM | POA: Diagnosis not present

## 2018-10-31 DIAGNOSIS — Z136 Encounter for screening for cardiovascular disorders: Secondary | ICD-10-CM | POA: Diagnosis not present

## 2018-10-31 NOTE — Patient Instructions (Addendum)
Start baby aspirin 81 mg per day  Go to the ER immediately if you have chest pain, shortness of breath, arm pain or any other severe symptoms.

## 2018-10-31 NOTE — Progress Notes (Signed)
Date:  10/31/2018   Name:  Edward Rogers   DOB:  1945/11/02   MRN:  [id number]   Chief Complaint: Gout (Labs.)  Hypertension  This is a chronic problem. The problem is unchanged. The problem is controlled. Associated symptoms include shortness of breath. Pertinent negatives include no chest pain, headaches, orthopnea, palpitations or PND. Past treatments include angiotensin blockers, beta blockers and calcium channel blockers. The current treatment provides significant improvement.  Shortness of Breath  This is a new problem. The current episode started more than 1 month ago. The problem occurs intermittently. The problem has been unchanged. Associated symptoms include wheezing. Pertinent negatives include no abdominal pain, chest pain, fever, headaches, leg swelling, orthopnea, PND, sore throat or syncope. The symptoms are aggravated by exercise. He has tried nothing for the symptoms. (Sarcoidosis)  Gout - recently started allopurinol for recurrent episodes of gout.  Lab Results  Component Value Date   LABURIC 8.1 06/13/2018     Review of Systems  Constitutional: Negative for chills, fatigue and fever.  HENT: Negative for sore throat and trouble swallowing.   Eyes: Negative for visual disturbance.  Respiratory: Positive for shortness of breath and wheezing. Negative for chest tightness.   Cardiovascular: Negative for chest pain, palpitations, orthopnea, leg swelling, syncope and PND.  Gastrointestinal: Negative for abdominal pain.  Genitourinary: Negative for difficulty urinating.  Musculoskeletal: Positive for arthralgias (intermittent left arm pain).  Neurological: Negative for dizziness, syncope, light-headedness and headaches.  Psychiatric/Behavioral: Negative for sleep disturbance.    Patient Active Problem List   Diagnosis Date Noted  . 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  . 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 Chronic left-sided low back pain  with left-sided sciatica 11/30/2016  . Gout 04/11/2015  . Dyslipidemia 04/11/2015  . Essential (primary) hypertension 04/11/2015  . Sarcoidosis 04/11/2015  . Other allergic rhinitis 04/11/2015  . Tobacco use disorder, moderate, in sustained remission 04/11/2015  . Elevated PSA 04/11/2015    No Known Allergies  Past Surgical History:  Procedure Laterality Date  . COLONOSCOPY  2009   normal  . PROSTATE BIOPSY  2014   normal    Social History   Tobacco Use  . Smoking status: Former Smoker    Packs/day: 0.50    Years: 30.00    Pack years: 15.00    Types: Cigarettes    Last attempt to quit: 08/30/2012    Years since quitting: 6.1  . Smokeless tobacco: Never Used  Substance Use Topics  . Alcohol use: Yes    Alcohol/week: 0.0 standard drinks    Comment: rarely  . Drug use: No     Medication list has been reviewed and updated.  Current Meds  Medication Sig  . allopurinol (ZYLOPRIM) 100 MG tablet Take 2 tablets (200 mg total) by mouth daily.  Marland Kitchen amLODipine (NORVASC) 10 MG tablet Take 1 tablet (10 mg total) by mouth daily.  Marland Kitchen atorvastatin (LIPITOR) 20 MG tablet Take 1 tablet (20 mg total) by mouth at bedtime.  . baclofen (LIORESAL) 10 MG tablet Take 1 tablet (10 mg total) by mouth at bedtime as needed for muscle spasms.  . colchicine 0.6 MG tablet Take 1 tablet (0.6 mg total) by mouth 2 (two) times daily as needed.  . fluticasone (FLONASE) 50 MCG/ACT nasal spray USE TWO SPRAY(S) IN EACH NOSTRIL ONCE DAILY AS NEEDED  . indomethacin (INDOCIN) 25 MG capsule TAKE ONE CAPSULE BY MOUTH THREE TIMES DAILY FOR GOUT (Patient taking differently: 3 (three)  times daily as needed. TAKE ONE CAPSULE BY MOUTH THREE TIMES DAILY FOR GOUT)  . irbesartan (AVAPRO) 300 MG tablet Take 1 tablet (300 mg total) by mouth daily.  . metoprolol succinate (TOPROL-XL) 50 MG 24 hr tablet Take 1 tablet (50 mg total) by mouth daily. Take with or immediately following a meal.    PHQ 2/9 Scores 10/31/2018  06/13/2018 01/26/2018 11/30/2016  PHQ - 2 Score 0 0 0 0    Physical Exam Vitals signs and nursing note reviewed.  Constitutional:      General: He is not in acute distress.    Appearance: He is well-developed.  HENT:     Head: Normocephalic and atraumatic.  Eyes:     Pupils: Pupils are equal, round, and reactive to light.  Neck:     Musculoskeletal: Normal range of motion and neck supple.     Vascular: No carotid bruit.  Cardiovascular:     Rate and Rhythm: Normal rate and regular rhythm.     Pulses: Normal pulses.  Pulmonary:     Effort: Pulmonary effort is normal. No respiratory distress.     Breath sounds: Normal breath sounds. No wheezing or rales.  Musculoskeletal: Normal range of motion.     Right lower leg: No edema.     Left lower leg: No edema.  Lymphadenopathy:     Cervical: No cervical adenopathy.  Skin:    General: Skin is warm and dry.     Findings: No rash.  Neurological:     Mental Status: He is alert and oriented to person, place, and time.  Psychiatric:        Behavior: Behavior normal.        Thought Content: Thought content normal.     BP 132/82   Pulse 76   Ht 5\' 5"  (1.651 m)   Wt 199 lb (90.3 kg)   SpO2 97%   BMI 33.12 kg/m   Assessment and Plan: 1. DOE (dyspnea on exertion) Concern for CAD Begin ASA 81 mg daily Go to ED if sx persist - EKG 12-Lead - NSR @ 68; possible LAE - DG Chest 2 View; Future - Ambulatory referral to Cardiology  2. Essential (primary) hypertension controlled - Basic metabolic panel  3. Idiopathic gout, unspecified chronicity, unspecified site Improved on allopurinal Check uric acid, BMP - Uric acid   Partially dictated using Animal nutritionist. Any errors are unintentional.  Bari Edward, MD [facility]  10/31/2018

## 2018-11-01 LAB — BASIC METABOLIC PANEL
BUN/Creatinine Ratio: 13 (ref 10–24)
BUN: 15 mg/dL (ref 8–27)
CALCIUM: 9.6 mg/dL (ref 8.6–10.2)
CHLORIDE: 104 mmol/L (ref 96–106)
CO2: 21 mmol/L (ref 20–29)
Creatinine, Ser: 1.2 mg/dL (ref 0.76–1.27)
GFR calc Af Amer: 68 mL/min/{1.73_m2} (ref >59)
GFR, EST NON AFRICAN AMERICAN: 59 mL/min/{1.73_m2} — AB (ref >59)
GLUCOSE: 100 mg/dL — AB (ref 65–99)
POTASSIUM: 4.1 mmol/L (ref 3.5–5.2)
Sodium: 143 mmol/L (ref 134–144)

## 2018-11-01 LAB — URIC ACID: URIC ACID: 8.2 mg/dL (ref 3.7–8.6)

## 2018-11-02 DIAGNOSIS — R0602 Shortness of breath: Secondary | ICD-10-CM | POA: Diagnosis not present

## 2018-11-02 DIAGNOSIS — Z87891 Personal history of nicotine dependence: Secondary | ICD-10-CM | POA: Diagnosis not present

## 2018-11-02 DIAGNOSIS — E6609 Other obesity due to excess calories: Secondary | ICD-10-CM | POA: Diagnosis not present

## 2018-11-02 DIAGNOSIS — Z6834 Body mass index (BMI) 34.0-34.9, adult: Secondary | ICD-10-CM | POA: Diagnosis not present

## 2018-11-02 DIAGNOSIS — I10 Essential (primary) hypertension: Secondary | ICD-10-CM | POA: Diagnosis not present

## 2018-11-02 DIAGNOSIS — I208 Other forms of angina pectoris: Secondary | ICD-10-CM | POA: Diagnosis not present

## 2018-11-02 DIAGNOSIS — M1A00X0 Idiopathic chronic gout, unspecified site, without tophus (tophi): Secondary | ICD-10-CM | POA: Diagnosis not present

## 2018-11-02 DIAGNOSIS — D869 Sarcoidosis, unspecified: Secondary | ICD-10-CM | POA: Diagnosis not present

## 2018-11-02 DIAGNOSIS — E785 Hyperlipidemia, unspecified: Secondary | ICD-10-CM | POA: Diagnosis not present

## 2018-11-22 DIAGNOSIS — R0602 Shortness of breath: Secondary | ICD-10-CM | POA: Diagnosis not present

## 2018-11-22 DIAGNOSIS — I208 Other forms of angina pectoris: Secondary | ICD-10-CM | POA: Diagnosis not present

## 2018-11-29 DIAGNOSIS — I10 Essential (primary) hypertension: Secondary | ICD-10-CM | POA: Diagnosis not present

## 2018-11-29 DIAGNOSIS — M1A00X0 Idiopathic chronic gout, unspecified site, without tophus (tophi): Secondary | ICD-10-CM | POA: Diagnosis not present

## 2018-11-29 DIAGNOSIS — Z87891 Personal history of nicotine dependence: Secondary | ICD-10-CM | POA: Diagnosis not present

## 2018-11-29 DIAGNOSIS — I208 Other forms of angina pectoris: Secondary | ICD-10-CM | POA: Diagnosis not present

## 2018-11-29 DIAGNOSIS — E6609 Other obesity due to excess calories: Secondary | ICD-10-CM | POA: Diagnosis not present

## 2018-11-29 DIAGNOSIS — D869 Sarcoidosis, unspecified: Secondary | ICD-10-CM | POA: Diagnosis not present

## 2018-11-29 DIAGNOSIS — E785 Hyperlipidemia, unspecified: Secondary | ICD-10-CM | POA: Diagnosis not present

## 2018-11-29 DIAGNOSIS — R0602 Shortness of breath: Secondary | ICD-10-CM | POA: Diagnosis not present

## 2018-11-29 DIAGNOSIS — Z6834 Body mass index (BMI) 34.0-34.9, adult: Secondary | ICD-10-CM | POA: Diagnosis not present

## 2018-12-17 ENCOUNTER — Other Ambulatory Visit: Payer: Self-pay | Admitting: Internal Medicine

## 2018-12-17 DIAGNOSIS — M1000 Idiopathic gout, unspecified site: Secondary | ICD-10-CM

## 2019-01-27 DIAGNOSIS — Z0100 Encounter for examination of eyes and vision without abnormal findings: Secondary | ICD-10-CM | POA: Diagnosis not present

## 2019-04-04 ENCOUNTER — Other Ambulatory Visit: Payer: Self-pay | Admitting: Internal Medicine

## 2019-04-22 DIAGNOSIS — D869 Sarcoidosis, unspecified: Secondary | ICD-10-CM | POA: Diagnosis not present

## 2019-04-23 ENCOUNTER — Other Ambulatory Visit: Payer: Self-pay | Admitting: Specialist

## 2019-04-23 DIAGNOSIS — D869 Sarcoidosis, unspecified: Secondary | ICD-10-CM

## 2019-04-25 ENCOUNTER — Other Ambulatory Visit
Admission: RE | Admit: 2019-04-25 | Discharge: 2019-04-25 | Disposition: A | Discharge status: Home / Self Care | Payer: Medicare HMO | Source: Home / Self Care | Attending: Specialist | Admitting: Specialist

## 2019-04-25 ENCOUNTER — Other Ambulatory Visit: Payer: Self-pay

## 2019-04-25 ENCOUNTER — Ambulatory Visit
Admission: RE | Admit: 2019-04-25 | Discharge: 2019-04-25 | Disposition: A | Discharge status: Home / Self Care | Payer: Medicare HMO | Source: Ambulatory Visit | Attending: Specialist | Admitting: Specialist

## 2019-04-25 DIAGNOSIS — I700 Atherosclerosis of aorta: Secondary | ICD-10-CM | POA: Diagnosis not present

## 2019-04-25 DIAGNOSIS — D869 Sarcoidosis, unspecified: Secondary | ICD-10-CM

## 2019-04-25 HISTORY — DX: Essential (primary) hypertension: I10

## 2019-04-25 LAB — BUN: BUN: 19 mg/dL (ref 8–23)

## 2019-04-25 LAB — CREATININE, SERUM
Creatinine, Ser: 1.4 mg/dL — ABNORMAL HIGH (ref 0.61–1.24)
GFR calc Af Amer: 57 mL/min — ABNORMAL LOW (ref >60)
GFR calc non Af Amer: 49 mL/min — ABNORMAL LOW (ref >60)

## 2019-04-25 MED ORDER — IOHEXOL 300 MG/ML  SOLN
75.0000 mL | Freq: Once | INTRAMUSCULAR | Status: AC | PRN
  Administered 2019-04-25: 75 mL via INTRAVENOUS

## 2019-05-04 ENCOUNTER — Other Ambulatory Visit: Payer: Self-pay | Admitting: Internal Medicine

## 2019-05-04 DIAGNOSIS — M1000 Idiopathic gout, unspecified site: Secondary | ICD-10-CM

## 2019-05-06 ENCOUNTER — Other Ambulatory Visit: Payer: Self-pay | Admitting: Internal Medicine

## 2019-05-06 DIAGNOSIS — M1000 Idiopathic gout, unspecified site: Secondary | ICD-10-CM

## 2019-05-20 DIAGNOSIS — D869 Sarcoidosis, unspecified: Secondary | ICD-10-CM | POA: Diagnosis not present

## 2019-05-20 DIAGNOSIS — J439 Emphysema, unspecified: Secondary | ICD-10-CM | POA: Diagnosis not present

## 2019-05-20 DIAGNOSIS — R0600 Dyspnea, unspecified: Secondary | ICD-10-CM | POA: Diagnosis not present

## 2019-06-12 ENCOUNTER — Ambulatory Visit (INDEPENDENT_AMBULATORY_CARE_PROVIDER_SITE_OTHER): Payer: Medicare HMO | Admitting: Internal Medicine

## 2019-06-12 ENCOUNTER — Other Ambulatory Visit: Payer: Self-pay

## 2019-06-12 ENCOUNTER — Encounter: Payer: Self-pay | Admitting: Internal Medicine

## 2019-06-12 VITALS — BP 152/88 | HR 73 | Ht 65.0 in | Wt 202.0 lb

## 2019-06-12 DIAGNOSIS — I10 Essential (primary) hypertension: Secondary | ICD-10-CM | POA: Diagnosis not present

## 2019-06-12 DIAGNOSIS — J3089 Other allergic rhinitis: Secondary | ICD-10-CM

## 2019-06-12 MED ORDER — MONTELUKAST SODIUM 10 MG PO TABS: 10.0000 mg | ORAL_TABLET | Freq: Every day | ORAL | 0 refills | Status: DC

## 2019-06-12 MED ORDER — METOPROLOL SUCCINATE ER 100 MG PO TB24: 100.0000 mg | ORAL_TABLET | Freq: Every day | ORAL | 1 refills | Status: DC

## 2019-06-12 NOTE — Progress Notes (Signed)
Date:  06/12/2019   Name:  Edward Rogers   DOB:  04-15-1946   MRN:  [id number]   Chief Complaint: Hypertension (X 1 month. Started on new meds. Seen Pulmonologist Dr Mayo Ao and was told BP too high- 191/95 on 05/20/2019.)  Hypertension This is a chronic problem. The problem is uncontrolled. Associated symptoms include shortness of breath (from Sarcoidosis and deconditioning). Pertinent negatives include no chest pain, headaches or palpitations. Past treatments include beta blockers, calcium channel blockers and angiotensin blockers. The current treatment provides moderate improvement.   Allergies - using flonase and only getting temporary relief.  He wants something to help with the mucus.  He also take Coricidin HBP at times which helps some too.  He recently visited the Pulmonologist and his BP was high.  Sarcoidosis - his sx are essentially stable.  Pulmonary felt that his disease was unchanged and that his mild SOB was due to deconditioning and weight rather than lung disease.  Review of Systems  Constitutional: Negative for chills, diaphoresis (sweats at night), fatigue and fever.  HENT: Negative for trouble swallowing.   Respiratory: Positive for shortness of breath (from Sarcoidosis and deconditioning). Negative for cough, chest tightness and wheezing.   Cardiovascular: Negative for chest pain, palpitations and leg swelling.  Skin: Negative for color change and rash.  Allergic/Immunologic: Negative for environmental allergies.  Neurological: Negative for dizziness, light-headedness and headaches.  Psychiatric/Behavioral: Negative for dysphoric mood and sleep disturbance.    Patient Active Problem List   Diagnosis Date Noted  . 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  . 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 Chronic left-sided low back pain with left-sided sciatica 11/30/2016  . Gout 04/11/2015  . Dyslipidemia 04/11/2015  . Essential (primary)  hypertension 04/11/2015  . Sarcoidosis 04/11/2015  . Other allergic rhinitis 04/11/2015  . Tobacco use disorder, moderate, in sustained remission 04/11/2015  . Elevated PSA 04/11/2015    No Known Allergies  Past Surgical History:  Procedure Laterality Date  . COLONOSCOPY  2009   normal  . PROSTATE BIOPSY  2014   normal    Social History   Tobacco Use  . Smoking status: Former Smoker    Packs/day: 0.50    Years: 30.00    Pack years: 15.00    Types: Cigarettes    Quit date: 08/30/2012    Years since quitting: 6.7  . Smokeless tobacco: Never Used  Substance Use Topics  . Alcohol use: Yes    Alcohol/week: 0.0 standard drinks    Comment: rarely  . Drug use: No     Medication list has been reviewed and updated.  Current Meds  Medication Sig  . allopurinol (ZYLOPRIM) 100 MG tablet Take 2 tablets (200 mg total) by mouth daily.  Marland Kitchen amLODipine (NORVASC) 10 MG tablet Take 1 tablet (10 mg total) by mouth daily.  Marland Kitchen atorvastatin (LIPITOR) 20 MG tablet Take 1 tablet (20 mg total) by mouth at bedtime.  . baclofen (LIORESAL) 10 MG tablet Take 1 tablet (10 mg total) by mouth at bedtime as needed for muscle spasms.  . colchicine 0.6 MG tablet Take 1 tablet by mouth twice daily as needed  . fluticasone (FLONASE) 50 MCG/ACT nasal spray USE 2 SPRAY(S) IN EACH NOSTRIL ONCE DAILY AS NEEDED  . indomethacin (INDOCIN) 25 MG capsule TAKE 1 CAPSULE BY MOUTH THREE TIMES DAILY FOR GOUT  . irbesartan (AVAPRO) 300 MG tablet Take 1 tablet (300 mg total) by mouth daily.  . metoprolol succinate (  TOPROL-XL) 100 MG 24 hr tablet Take 1 tablet (100 mg total) by mouth daily. Take with or immediately following a meal.  . [DISCONTINUED] metoprolol succinate (TOPROL-XL) 50 MG 24 hr tablet Take 1 tablet (50 mg total) by mouth daily. Take with or immediately following a meal.    PHQ 2/9 Scores 06/12/2019 10/31/2018 06/13/2018 01/26/2018  PHQ - 2 Score 1 0 0 0  PHQ- 9 Score 1 - - -    BP Readings from Last 3  Encounters:  06/12/19 (!) 168/88  10/31/18 132/82  08/01/18 (!) 145/86    Physical Exam Vitals signs and nursing note reviewed.  Constitutional:      General: He is not in acute distress.    Appearance: Normal appearance. He is well-developed.  HENT:     Head: Normocephalic and atraumatic.     Right Ear: Tympanic membrane normal.     Left Ear: Tympanic membrane normal.     Nose:     Right Sinus: No maxillary sinus tenderness or frontal sinus tenderness.     Left Sinus: No maxillary sinus tenderness or frontal sinus tenderness.  Neck:     Musculoskeletal: Normal range of motion.  Cardiovascular:     Rate and Rhythm: Normal rate and regular rhythm.     Pulses: Normal pulses.     Heart sounds: No murmur.  Pulmonary:     Effort: Pulmonary effort is normal. No respiratory distress.     Breath sounds: No wheezing or rhonchi.  Musculoskeletal: Normal range of motion.     Right lower leg: No edema.     Left lower leg: No edema.  Lymphadenopathy:     Cervical: No cervical adenopathy.  Skin:    General: Skin is warm and dry.     Capillary Refill: Capillary refill takes less than 2 seconds.     Findings: No rash.  Neurological:     General: No focal deficit present.     Mental Status: He is alert and oriented to person, place, and time.  Psychiatric:        Behavior: Behavior normal.        Thought Content: Thought content normal.     Wt Readings from Last 3 Encounters:  06/12/19 202 lb (91.6 kg)  10/31/18 199 lb (90.3 kg)  08/01/18 200 lb 6.4 oz (90.9 kg)    BP (!) 168/88   Pulse 73   Ht 5\' 5"  (1.651 m)   Wt 202 lb (91.6 kg)   SpO2 98%   BMI 33.61 kg/m   Assessment and Plan: 1. Essential (primary) hypertension Clinically stable exam with poorly controlled BP.   Tolerating medications, metoprolol 50 mg, avapro 300 mg and amlodipine 10 mg daily , without side effects at this time. Pt to continue  low sodium diet; benefits of regular exercise as able discussed.  Increase metoprolol to 100 mg daily - metoprolol succinate (TOPROL-XL) 100 MG 24 hr tablet; Take 1 tablet (100 mg total) by mouth daily. Take with or immediately following a meal.  Dispense: 90 tablet; Refill: 1  2. Environmental and seasonal allergies Will add singulair to help with allergy sx No evidence of bacterial infection noted today - montelukast (SINGULAIR) 10 MG tablet; Take 1 tablet (10 mg total) by mouth at bedtime.  Dispense: 30 tablet; Refill: 0   Partially dictated using Editor, commissioning. Any errors are unintentional.  Halina Maidens, MD [facility]  06/12/2019

## 2019-06-17 ENCOUNTER — Encounter: Payer: Self-pay | Admitting: Internal Medicine

## 2019-07-15 LAB — FECAL OCCULT BLOOD, GUAIAC: Fecal Occult Blood: NEGATIVE

## 2019-08-07 ENCOUNTER — Other Ambulatory Visit: Payer: Self-pay | Admitting: Internal Medicine

## 2019-09-10 ENCOUNTER — Other Ambulatory Visit: Payer: Self-pay | Admitting: Internal Medicine

## 2019-09-17 ENCOUNTER — Ambulatory Visit: Payer: Medicare HMO | Admitting: Internal Medicine

## 2019-09-24 DIAGNOSIS — D869 Sarcoidosis, unspecified: Secondary | ICD-10-CM | POA: Diagnosis not present

## 2019-09-24 DIAGNOSIS — R0600 Dyspnea, unspecified: Secondary | ICD-10-CM | POA: Diagnosis not present

## 2019-09-24 DIAGNOSIS — I10 Essential (primary) hypertension: Secondary | ICD-10-CM | POA: Diagnosis not present

## 2019-09-24 DIAGNOSIS — J439 Emphysema, unspecified: Secondary | ICD-10-CM | POA: Diagnosis not present

## 2019-09-30 ENCOUNTER — Other Ambulatory Visit: Payer: Self-pay | Admitting: Internal Medicine

## 2019-09-30 DIAGNOSIS — I10 Essential (primary) hypertension: Secondary | ICD-10-CM

## 2019-10-09 ENCOUNTER — Ambulatory Visit (INDEPENDENT_AMBULATORY_CARE_PROVIDER_SITE_OTHER): Payer: Medicare HMO | Admitting: Internal Medicine

## 2019-10-09 ENCOUNTER — Encounter: Payer: Self-pay | Admitting: Internal Medicine

## 2019-10-09 ENCOUNTER — Other Ambulatory Visit: Payer: Self-pay | Admitting: Internal Medicine

## 2019-10-09 ENCOUNTER — Other Ambulatory Visit: Payer: Self-pay

## 2019-10-09 VITALS — BP 152/98 | HR 60 | Temp 97.8°F | Ht 65.0 in | Wt 196.0 lb

## 2019-10-09 DIAGNOSIS — E785 Hyperlipidemia, unspecified: Secondary | ICD-10-CM | POA: Diagnosis not present

## 2019-10-09 DIAGNOSIS — M1000 Idiopathic gout, unspecified site: Secondary | ICD-10-CM

## 2019-10-09 DIAGNOSIS — I10 Essential (primary) hypertension: Secondary | ICD-10-CM | POA: Diagnosis not present

## 2019-10-09 DIAGNOSIS — J3089 Other allergic rhinitis: Secondary | ICD-10-CM | POA: Diagnosis not present

## 2019-10-09 MED ORDER — HYDRALAZINE HCL 25 MG PO TABS: 25.0000 mg | ORAL_TABLET | Freq: Three times a day (TID) | ORAL | 1 refills | Status: DC

## 2019-10-09 MED ORDER — COLCHICINE 0.6 MG PO TABS: 0.6000 mg | ORAL_TABLET | Freq: Two times a day (BID) | ORAL | 0 refills | Status: DC | PRN

## 2019-10-09 MED ORDER — ALLOPURINOL 100 MG PO TABS: 200.0000 mg | ORAL_TABLET | Freq: Every day | ORAL | 1 refills | Status: DC

## 2019-10-09 MED ORDER — FLUTICASONE PROPIONATE 50 MCG/ACT NA SUSP: 2.0000 | Freq: Every day | NASAL | 1 refills | Status: DC

## 2019-10-09 NOTE — Progress Notes (Signed)
Date:  10/09/2019   Name:  Edward Rogers   DOB:  [birth year]   MRN:  [id number]   Chief Complaint: Hypertension (PHQ9- 8. BP is staying around 183/100 for the last few weeks. )  Hypertension This is a chronic problem. The problem is resistant. Associated symptoms include shortness of breath. Pertinent negatives include no chest pain, headaches or palpitations. Past treatments include angiotensin blockers, beta blockers and calcium channel blockers (Dr. Meredeth Ide stopped Avapro and started losartan hct last month but pt did not start due to side effects from hctz in the past). The current treatment provides moderate improvement.  Hyperlipidemia This is a chronic problem. The problem is controlled. Associated symptoms include shortness of breath. Pertinent negatives include no chest pain. Current antihyperlipidemic treatment includes statins. The current treatment provides significant improvement of lipids.  Gout - on Allopurinol 200 mg daily for prevention.  Uses Colchicine as needed.  He needs refills on both medications. Allergies - he has chronic sinus congestion and drainage.  Dr. Meredeth Ide prescribed Allegra but he has not seen much benefit.  He uses Flonase but only as needed, not daily.  Lab Results  Component Value Date   CREATININE 1.40 (H) 04/25/2019   BUN 19 04/25/2019   NA 143 10/31/2018   K 4.1 10/31/2018   CL 104 10/31/2018   CO2 21 10/31/2018   Lab Results  Component Value Date   CHOL 180 06/13/2018   HDL 25 (L) 06/13/2018   LDLCALC 93 06/13/2018   TRIG 310 (H) 06/13/2018   CHOLHDL 7.2 (H) 06/13/2018   Lab Results  Component Value Date   TSH 1.10 04/24/2014   No results found for: HGBA1C   Review of Systems  Constitutional: Negative for chills, fatigue and fever.  HENT: Positive for congestion and sinus pressure. Negative for sore throat and trouble swallowing.   Respiratory: Positive for shortness of breath. Negative for cough, chest tightness and wheezing.    Cardiovascular: Negative for chest pain, palpitations and leg swelling.  Gastrointestinal: Negative for abdominal pain and vomiting.  Genitourinary: Negative for dysuria.  Neurological: Negative for dizziness and headaches.  Psychiatric/Behavioral: Negative for dysphoric mood and sleep disturbance. The patient is not nervous/anxious.     Patient Active Problem List   Diagnosis Date Noted  . 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  . 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 Chronic left-sided low back pain with left-sided sciatica 11/30/2016  . Gout 04/11/2015  . Dyslipidemia 04/11/2015  . Essential (primary) hypertension 04/11/2015  . Sarcoidosis 04/11/2015  . Other allergic rhinitis 04/11/2015  . Tobacco use disorder, moderate, in sustained remission 04/11/2015  . Elevated PSA 04/11/2015    No Known Allergies  Past Surgical History:  Procedure Laterality Date  . COLONOSCOPY  2009   normal  . PROSTATE BIOPSY  2014   normal    Social History   Tobacco Use  . Smoking status: Former Smoker    Packs/day: 0.50    Years: 30.00    Pack years: 15.00    Types: Cigarettes    Quit date: 08/30/2012    Years since quitting: 7.1  . Smokeless tobacco: Never Used  Substance Use Topics  . Alcohol use: Yes    Alcohol/week: 0.0 standard drinks    Comment: rarely  . Drug use: No     Medication list has been reviewed and updated.  Current Meds  Medication Sig  . albuterol (VENTOLIN HFA) 108 (90 Base) MCG/ACT inhaler SMARTSIG:2 Puff(s) By  Mouth Every 6 Hours PRN  . allopurinol (ZYLOPRIM) 100 MG tablet Take 2 tablets (200 mg total) by mouth daily.  Marland Kitchen amLODipine (NORVASC) 10 MG tablet Take 1 tablet (10 mg total) by mouth daily.  Marland Kitchen atorvastatin (LIPITOR) 20 MG tablet Take 1 tablet (20 mg total) by mouth at bedtime.  . colchicine 0.6 MG tablet Take 1 tablet by mouth twice daily as needed  . fexofenadine (ALLEGRA) 180 MG tablet Take 180 mg by mouth daily.  . fluticasone  (FLONASE) 50 MCG/ACT nasal spray USE 2 SPRAY(S) IN EACH NOSTRIL ONCE DAILY AS NEEDED  . indomethacin (INDOCIN) 25 MG capsule TAKE 1 CAPSULE BY MOUTH THREE TIMES DAILY FOR GOUT  . irbesartan (AVAPRO) 300 MG tablet Take 1 tablet by mouth once daily  . metoprolol succinate (TOPROL-XL) 100 MG 24 hr tablet Take 1 tablet (100 mg total) by mouth daily. Take with or immediately following a meal.  . montelukast (SINGULAIR) 10 MG tablet Take 1 tablet (10 mg total) by mouth at bedtime.    PHQ 2/9 Scores 10/09/2019 06/12/2019 10/31/2018 06/13/2018  PHQ - 2 Score 4 1 0 0  PHQ- 9 Score 8 1 - -    BP Readings from Last 3 Encounters:  10/09/19 (!) 152/98  06/12/19 (!) 152/88  10/31/18 132/82    Physical Exam Vitals and nursing note reviewed.  Constitutional:      General: He is not in acute distress.    Appearance: He is well-developed.  HENT:     Head: Normocephalic and atraumatic.     Nose:     Right Sinus: No maxillary sinus tenderness or frontal sinus tenderness.     Left Sinus: No maxillary sinus tenderness or frontal sinus tenderness.  Neck:     Vascular: No carotid bruit.  Cardiovascular:     Rate and Rhythm: Normal rate and regular rhythm.     Pulses: Normal pulses.  Pulmonary:     Effort: Pulmonary effort is normal. No respiratory distress.     Breath sounds: No wheezing or rhonchi.  Musculoskeletal:        General: Normal range of motion.     Cervical back: Normal range of motion.     Right lower leg: No edema.     Left lower leg: No edema.  Lymphadenopathy:     Cervical: No cervical adenopathy.  Skin:    General: Skin is warm and dry.     Capillary Refill: Capillary refill takes less than 2 seconds.     Findings: No rash.  Neurological:     General: No focal deficit present.     Mental Status: He is alert and oriented to person, place, and time.  Psychiatric:        Behavior: Behavior normal.        Thought Content: Thought content normal.     Wt Readings from Last 3  Encounters:  10/09/19 196 lb (88.9 kg)  06/12/19 202 lb (91.6 kg)  10/31/18 199 lb (90.3 kg)    BP (!) 152/98 (BP Location: Left Arm, Patient Position: Sitting, Cuff Size: Large)   Pulse 60   Temp 97.8 F (36.6 C) (Oral)   Ht 5\' 5"  (1.651 m)   Wt 196 lb (88.9 kg)   SpO2 97%   BMI 32.62 kg/m   Assessment and Plan: 1. Essential (primary) hypertension Uncontrolled on three agents, will add hydralazine Pt will continue efforts at weight loss Recheck in 3 months - CBC with Differential/Platelet - Comprehensive metabolic panel -  hydrALAZINE (APRESOLINE) 25 MG tablet; Take 1 tablet (25 mg total) by mouth 3 (three) times daily.  Dispense: 270 tablet; Refill: 1  2. Dyslipidemia On Lipitor without side effects - Lipid panel  3. Idiopathic gout, unspecified chronicity, unspecified site Fairly well controlled with only occasional flares - Uric acid - colchicine 0.6 MG tablet; Take 1 tablet (0.6 mg total) by mouth 2 (two) times daily as needed.  Dispense: 30 tablet; Refill: 0 - allopurinol (ZYLOPRIM) 100 MG tablet; Take 2 tablets (200 mg total) by mouth daily.  Dispense: 180 tablet; Refill: 1  4. Environmental and seasonal allergies Recommend that he take Allegra every day along with Flonase spray every day - fluticasone (FLONASE) 50 MCG/ACT nasal spray; Place 2 sprays into both nostrils daily.  Dispense: 48 g; Refill: 1   Partially dictated using Animal nutritionist. Any errors are unintentional.  Bari Edward, MD [facility]  10/09/2019

## 2019-10-09 NOTE — Patient Instructions (Signed)
Covid Vaccine locations:  [facility] [phone]  [place] [phone]  [email or web address]  

## 2019-10-10 LAB — CBC WITH DIFFERENTIAL/PLATELET
Basophils Absolute: 0.1 10*3/uL (ref 0.0–0.2)
Basos: 2 %
EOS (ABSOLUTE): 0.6 10*3/uL — ABNORMAL HIGH (ref 0.0–0.4)
Eos: 13 %
Hematocrit: 47.8 % (ref 37.5–51.0)
Hemoglobin: 15.8 g/dL (ref 13.0–17.7)
Immature Grans (Abs): 0 10*3/uL (ref 0.0–0.1)
Immature Granulocytes: 0 %
Lymphocytes Absolute: 1.7 10*3/uL (ref 0.7–3.1)
Lymphs: 38 %
MCH: 27.3 pg (ref 26.6–33.0)
MCHC: 33.1 g/dL (ref 31.5–35.7)
MCV: 83 fL (ref 79–97)
Monocytes Absolute: 0.6 10*3/uL (ref 0.1–0.9)
Monocytes: 14 %
Neutrophils Absolute: 1.5 10*3/uL (ref 1.4–7.0)
Neutrophils: 33 %
Platelets: 242 10*3/uL (ref 150–450)
RBC: 5.78 x10E6/uL (ref 4.14–5.80)
RDW: 14.6 % (ref 11.6–15.4)
WBC: 4.5 10*3/uL (ref 3.4–10.8)

## 2019-10-10 LAB — LIPID PANEL
Chol/HDL Ratio: 8.4 ratio — ABNORMAL HIGH (ref 0.0–5.0)
Cholesterol, Total: 219 mg/dL — ABNORMAL HIGH (ref 100–199)
HDL: 26 mg/dL — ABNORMAL LOW (ref >39)
LDL Chol Calc (NIH): 129 mg/dL — ABNORMAL HIGH (ref 0–99)
Triglycerides: 354 mg/dL — ABNORMAL HIGH (ref 0–149)
VLDL Cholesterol Cal: 64 mg/dL — ABNORMAL HIGH (ref 5–40)

## 2019-10-10 LAB — COMPREHENSIVE METABOLIC PANEL
ALT: 18 IU/L (ref 0–44)
AST: 22 IU/L (ref 0–40)
Albumin/Globulin Ratio: 1.3 (ref 1.2–2.2)
Albumin: 4.4 g/dL (ref 3.7–4.7)
Alkaline Phosphatase: 100 IU/L (ref 39–117)
BUN/Creatinine Ratio: 12 (ref 10–24)
BUN: 17 mg/dL (ref 8–27)
Bilirubin Total: 0.3 mg/dL (ref 0.0–1.2)
CO2: 21 mmol/L (ref 20–29)
Calcium: 9.8 mg/dL (ref 8.6–10.2)
Chloride: 104 mmol/L (ref 96–106)
Creatinine, Ser: 1.46 mg/dL — ABNORMAL HIGH (ref 0.76–1.27)
GFR calc Af Amer: 54 mL/min/{1.73_m2} — ABNORMAL LOW (ref >59)
GFR calc non Af Amer: 46 mL/min/{1.73_m2} — ABNORMAL LOW (ref >59)
Globulin, Total: 3.3 g/dL (ref 1.5–4.5)
Glucose: 105 mg/dL — ABNORMAL HIGH (ref 65–99)
Potassium: 4.2 mmol/L (ref 3.5–5.2)
Sodium: 142 mmol/L (ref 134–144)
Total Protein: 7.7 g/dL (ref 6.0–8.5)

## 2019-10-10 LAB — URIC ACID: Uric Acid: 9.1 mg/dL — ABNORMAL HIGH (ref 3.8–8.4)

## 2019-10-22 ENCOUNTER — Other Ambulatory Visit: Payer: Self-pay | Admitting: Specialist

## 2019-10-22 DIAGNOSIS — R0600 Dyspnea, unspecified: Secondary | ICD-10-CM | POA: Diagnosis not present

## 2019-10-22 DIAGNOSIS — I10 Essential (primary) hypertension: Secondary | ICD-10-CM

## 2019-10-22 DIAGNOSIS — R918 Other nonspecific abnormal finding of lung field: Secondary | ICD-10-CM | POA: Diagnosis not present

## 2019-10-22 DIAGNOSIS — J439 Emphysema, unspecified: Secondary | ICD-10-CM

## 2019-10-22 DIAGNOSIS — D869 Sarcoidosis, unspecified: Secondary | ICD-10-CM

## 2020-01-01 ENCOUNTER — Ambulatory Visit: Payer: Medicare HMO | Admitting: Internal Medicine

## 2020-01-13 IMAGING — CT CT CHEST WITH CONTRAST
1 series · 14 of 34 positions shown, 18 images · IV contrast (omnipaque)
Comparison: 10/31/2018 chest radiograph.

CLINICAL DATA: Reported remote diagnosis of pulmonary sarcoidosis
on 2916 lung biopsy. Patient presents with dyspnea with exertion for
several weeks. No history of cancer.

EXAM:
CT CHEST WITH CONTRAST
TECHNIQUE: Multidetector CT imaging of the chest was performed during
intravenous contrast administration.
CONTRAST:  75mL OMNIPAQUE IOHEXOL 300 MG/ML  SOLN

[Series 2: axial st · axial · 0.68mm/px · z∈[-638,-368]mm · 14 of 159 slices shown, 18 images]
[im 12/159  mediastinal]
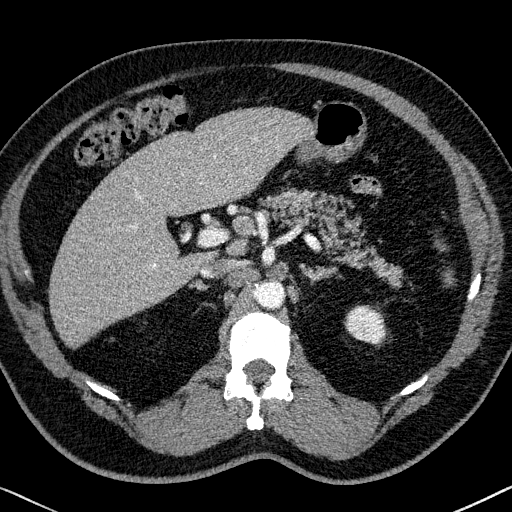
[im 12/159  lung]
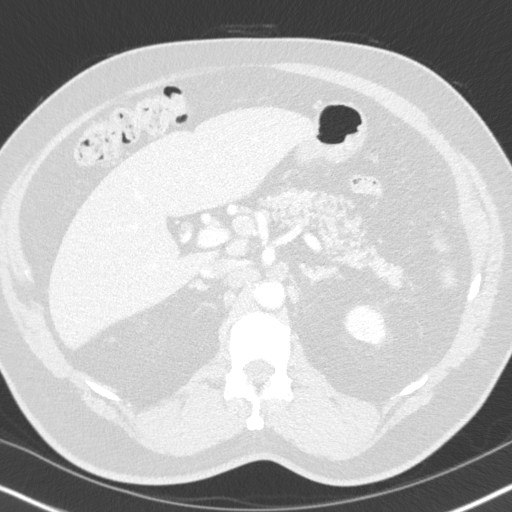
[im 24/159  lung]
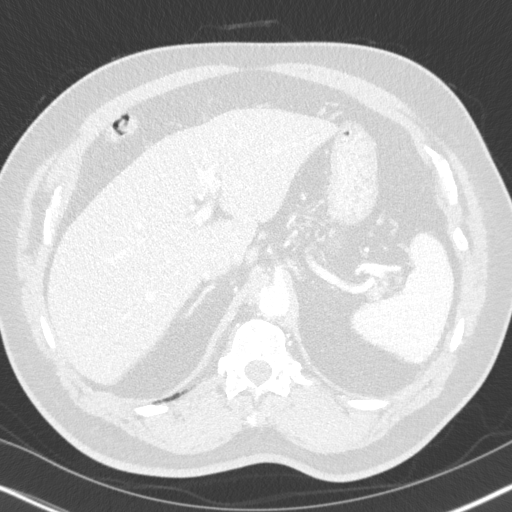
[im 32/159  lung]
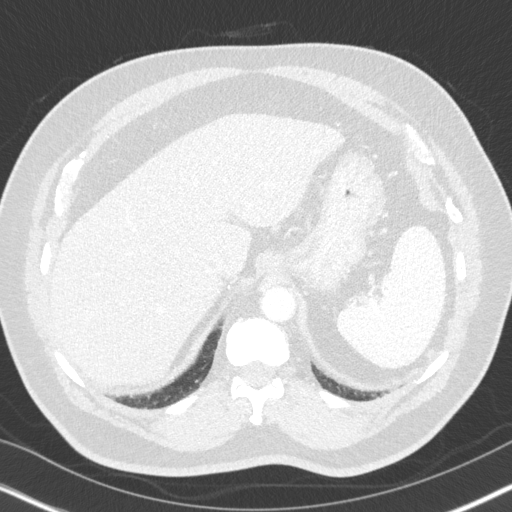
[im 47/159  lung]
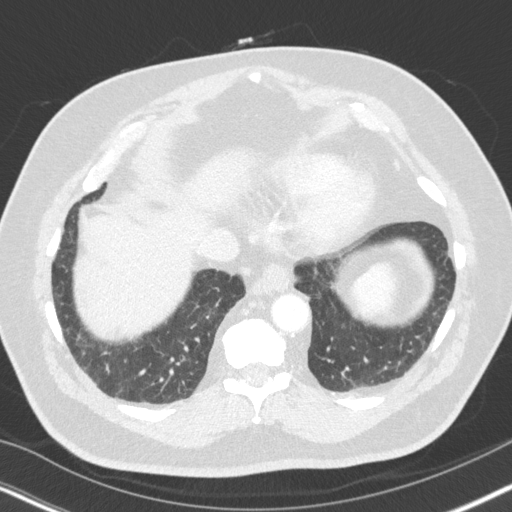
[im 59/159  mediastinal]
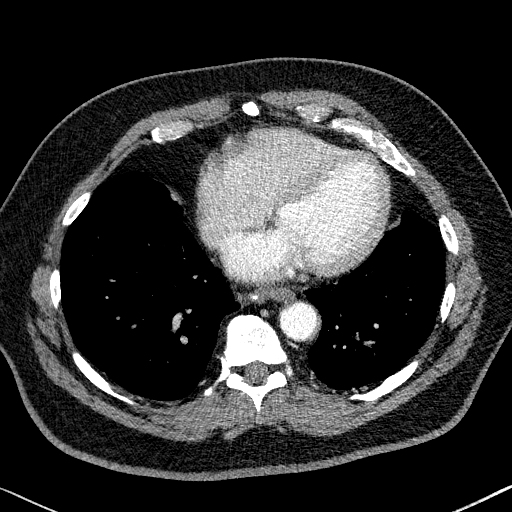
[im 59/159  lung]
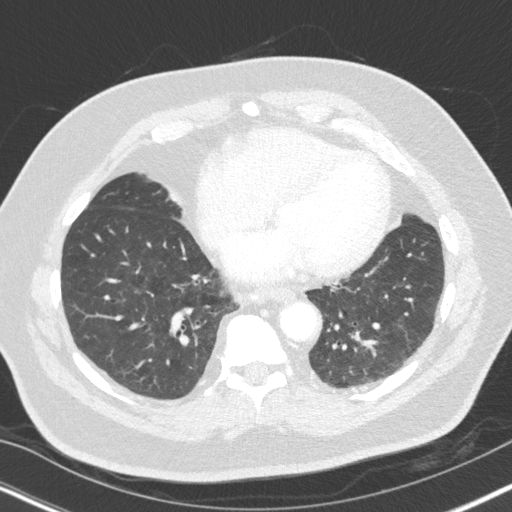
[im 65/159  lung]
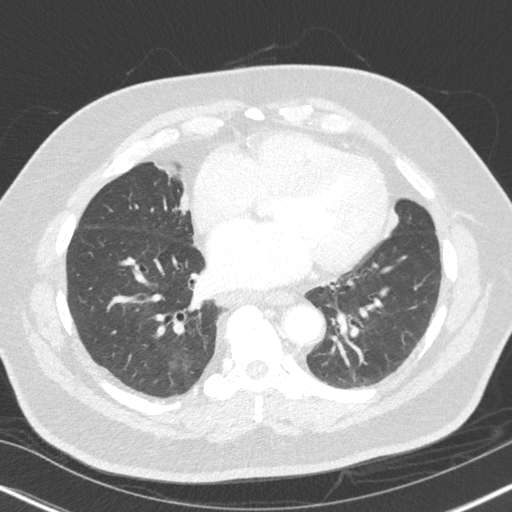
[im 76/159  lung]
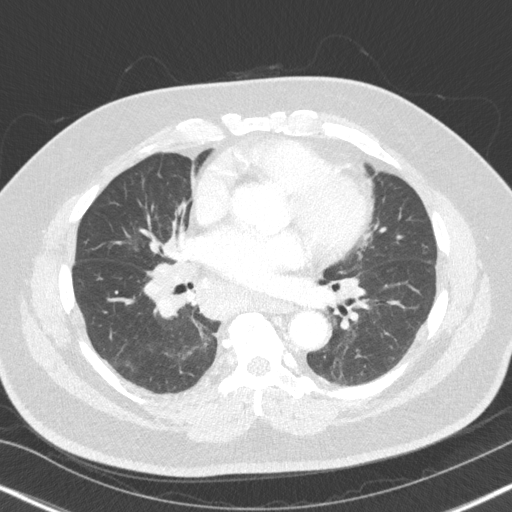
[im 85/159  lung]
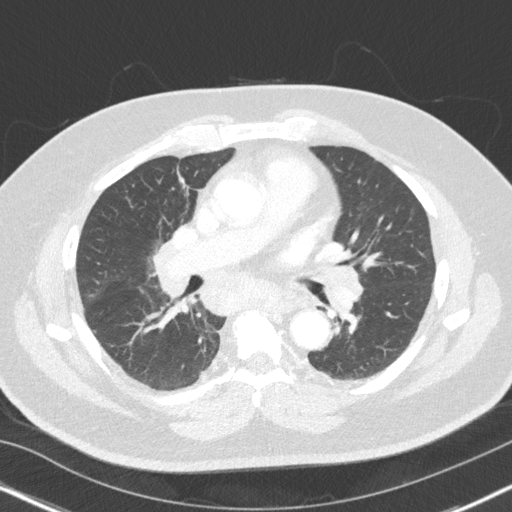
[im 94/159  mediastinal]
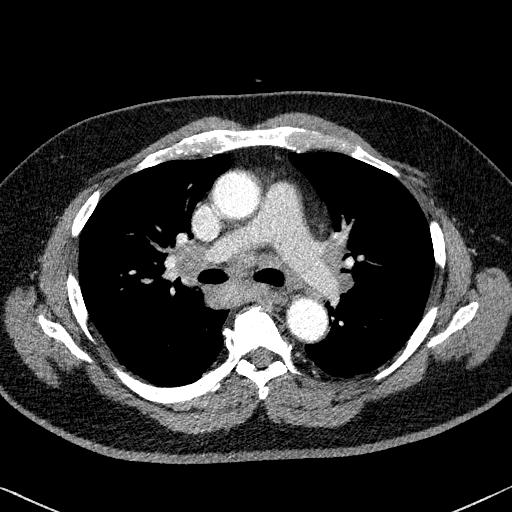
[im 94/159  lung]
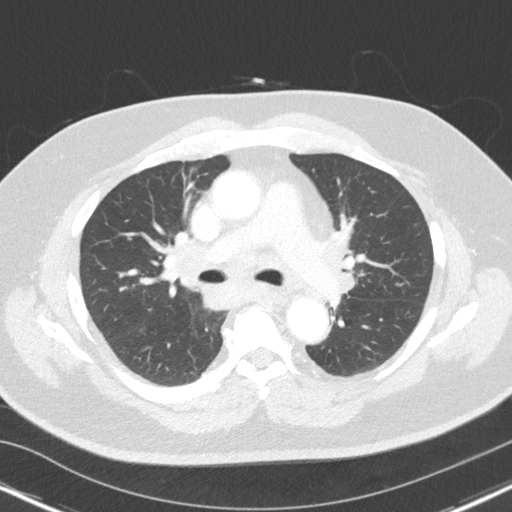
[im 100/159  lung]
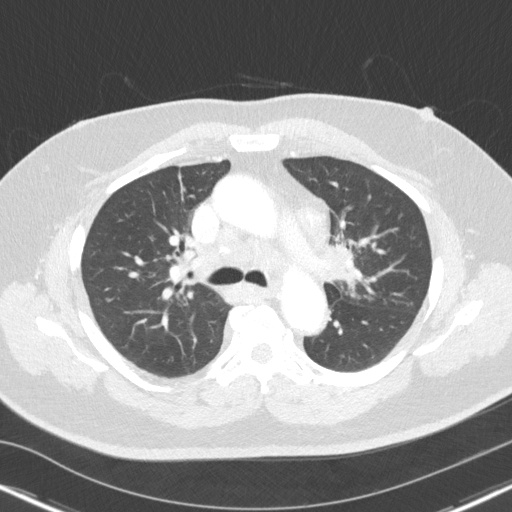
[im 118/159  lung]
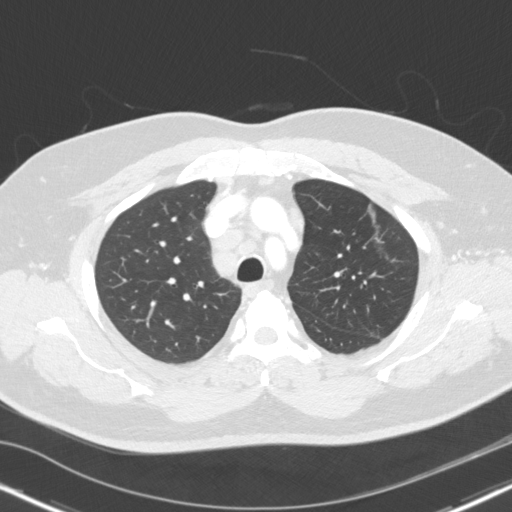
[im 127/159  lung]
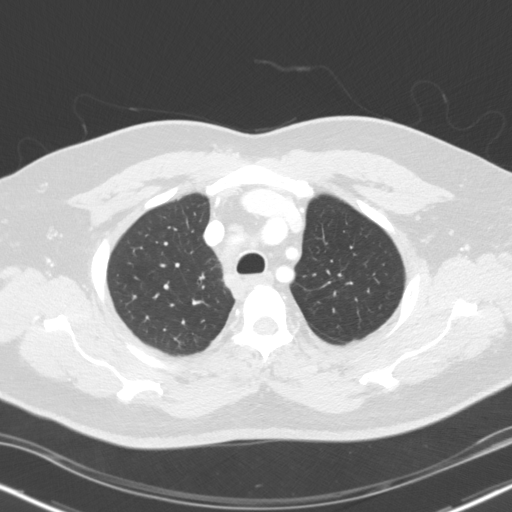
[im 135/159  mediastinal]
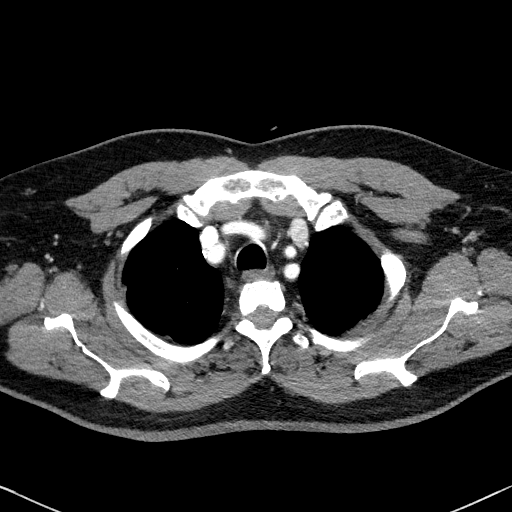
[im 135/159  lung]
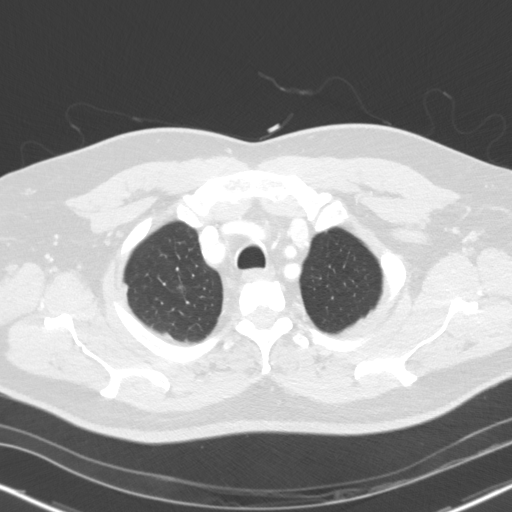
[im 147/159  lung]
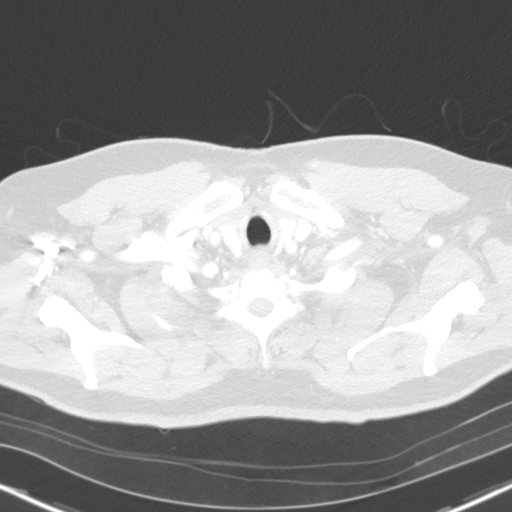

[14 of 34 positions shown; findings below may reference images not displayed]

FINDINGS: Cardiovascular: Normal heart size. No significant pericardial
effusion/thickening. Three-vessel coronary atherosclerosis.
Atherosclerotic nonaneurysmal thoracic aorta. Normal caliber
pulmonary arteries. No central pulmonary emboli.

Mediastinum/Nodes: Hypodense subcentimeter left thyroid lobe nodule.
Unremarkable esophagus. No axillary adenopathy. Moderate bilateral
mediastinal lymphadenopathy involving bilateral paratracheal, left
prevascular and subcarinal chains, with scattered coarse
calcifications within several of the enlarged mediastinal lymph
nodes. Representative 2.4 cm right paratracheal node (series 2/image
51). Enlarged 2.8 cm subcarinal node (series 2/image 75). Left
prevascular 1.8 cm node (series 2/image 58). Moderate bilateral
hilar lymphadenopathy, largest 2.0 cm on the right (series 2/image
66) and 1.6 cm on the left (series 2/image 77).

Lungs/Pleura: No pneumothorax. No pleural effusion. No acute
consolidative airspace disease or lung masses. Mild mosaic
attenuation throughout both lungs. There is mild patchy thickening
of the peribronchovascular interstitium throughout both lungs with
associated scattered peribronchovascular nodularity, ground-glass
opacity, reticulation, mild traction bronchiectasis and mild
architectural distortion. A few scattered thin parenchymal bands are
present in the mid to upper lungs bilaterally. There is no clear
apicobasilar gradient to these findings.

Upper abdomen: There is mild upper retroperitoneal lymphadenopathy
involving gastrohepatic ligament, porta hepatis, periceliac, left
para-aortic and aortocaval chains, for example a 1.4 cm aortocaval
node with coarse calcification (series 2/image 157).

Musculoskeletal: No aggressive appearing focal osseous lesions.
Moderate thoracic spondylosis.
IMPRESSION: 1. Moderate bilateral mediastinal, moderate bilateral hilar and mild
upper retroperitoneal lymphadenopathy, variably calcified,
compatible with sarcoidosis.
2. Pulmonary parenchymal findings throughout both lungs compatible
with pulmonary sarcoidosis with mild fibrosis.
3. Nonspecific mild mosaic attenuation throughout both lungs, most
commonly due to air trapping, as can be seen with sarcoidosis.
4. Three-vessel coronary atherosclerosis.

Aortic Atherosclerosis ([id number]).

## 2020-04-29 ENCOUNTER — Other Ambulatory Visit: Payer: Self-pay | Admitting: Internal Medicine

## 2020-04-29 DIAGNOSIS — I10 Essential (primary) hypertension: Secondary | ICD-10-CM

## 2020-04-29 NOTE — Telephone Encounter (Signed)
Requested Prescriptions  Pending Prescriptions Disp Refills   amLODipine (NORVASC) 10 MG tablet [Pharmacy Med Name: amLODIPine Besylate 10 MG Oral Tablet] 30 tablet 0    Sig: Take 1 tablet by mouth once daily     Cardiovascular:  Calcium Channel Blockers Failed - 04/29/2020 11:34 AM      Failed - Last BP in normal range    BP Readings from Last 1 Encounters:  10/09/19 (!) 152/98         Failed - Valid encounter within last 6 months    Recent Outpatient Visits          6 months ago Essential (primary) hypertension   [facility] Reubin Milan, MD   10 months ago Essential (primary) hypertension   [facility] Reubin Milan, MD   1 year ago DOE (dyspnea on exertion)   [facility] Reubin Milan, MD   1 year ago Essential (primary) hypertension   [facility] Reubin Milan, MD   1 year ago Medicare annual wellness visit, subsequent   [facility] Reubin Milan, MD             Called and spoke with someone at home number.  She will give him message to call for appointment. Last OV 09/2019. Gave 30 day courtesy refill

## 2020-05-06 ENCOUNTER — Other Ambulatory Visit: Payer: Self-pay | Admitting: Internal Medicine

## 2020-05-06 DIAGNOSIS — M1000 Idiopathic gout, unspecified site: Secondary | ICD-10-CM

## 2020-05-06 DIAGNOSIS — I10 Essential (primary) hypertension: Secondary | ICD-10-CM

## 2020-05-06 NOTE — Telephone Encounter (Signed)
Requested medication (s) are due for refill today: yes  Requested medication (s) are on the active medication list: yes  Last refill: indocin 10/09/19  #16 0 refills,  Avapro 09/30/19  #90  0 refills  Future visit scheduled:No  Notes to clinic:  Called left VM to return call to office for appointment. Patient need labs rechecked  failed protocol because labs ar high    Requested Prescriptions  Pending Prescriptions Disp Refills   indomethacin (INDOCIN) 25 MG capsule [Pharmacy Med Name: Indomethacin 25 MG Oral Capsule] 16 capsule 0    Sig: TAKE 1 CAPSULE BY MOUTH THREE TIMES DAILY FOR GOUT      Analgesics:  NSAIDS Failed - 05/06/2020  4:23 PM      Failed - Cr in normal range and within 360 days    Creatinine, Ser  Date Value Ref Range Status  10/09/2019 1.46 (H) 0.76 - 1.27 mg/dL Final          Passed - HGB in normal range and within 360 days    Hemoglobin  Date Value Ref Range Status  10/09/2019 15.8 13.0 - 17.7 g/dL Final          Passed - Patient is not pregnant      Passed - Valid encounter within last 12 months    Recent Outpatient Visits           7 months ago Essential (primary) hypertension   [facility] Reubin Milan, MD   10 months ago Essential (primary) hypertension   [facility] Reubin Milan, MD   1 year ago DOE (dyspnea on exertion)   [facility] Reubin Milan, MD   1 year ago Essential (primary) hypertension   [facility] Reubin Milan, MD   1 year ago Medicare annual wellness visit, subsequent   [facility] Reubin Milan, MD                irbesartan (AVAPRO) 300 MG tablet [Pharmacy Med Name: IRBESARTAN 300MG      TAB] 90 tablet 0    Sig: Take 1 tablet by mouth once daily      Cardiovascular:  Angiotensin Receptor Blockers Failed - 05/06/2020  4:23 PM      Failed - Cr in normal range and within 180 days    Creatinine, Ser  Date Value Ref Range Status  10/09/2019 1.46 (H) 0.76  - 1.27 mg/dL Final          Failed - K in normal range and within 180 days    Potassium  Date Value Ref Range Status  10/09/2019 4.2 3.5 - 5.2 mmol/L Final          Failed - Last BP in normal range    BP Readings from Last 1 Encounters:  10/09/19 (!) 152/98          Failed - Valid encounter within last 6 months    Recent Outpatient Visits           7 months ago Essential (primary) hypertension   [facility] 12/07/19, MD   10 months ago Essential (primary) hypertension   [facility], MD   1 year ago DOE (dyspnea on exertion)   [facility], MD   1 year ago Essential (primary) hypertension   [facility], MD   1 year ago Medicare annual wellness visit, subsequent   [facility] Reubin Milan, MD              Passed - Patient is not pregnant

## 2020-07-02 ENCOUNTER — Other Ambulatory Visit: Payer: Self-pay | Admitting: Internal Medicine

## 2020-07-02 DIAGNOSIS — M1000 Idiopathic gout, unspecified site: Secondary | ICD-10-CM

## 2020-07-02 NOTE — Telephone Encounter (Signed)
Requested medication (s) are due for refill today:  Yes  Requested medication (s) are on the active medication list:  Yes  Future visit scheduled:  No  Last Refill: 10/09/19; #16; no refills  Notes to clinic:  failed protocol due to elevated Creatinine; please advise  Requested Prescriptions  Pending Prescriptions Disp Refills   indomethacin (INDOCIN) 25 MG capsule [Pharmacy Med Name: Indomethacin 25 MG Oral Capsule] 16 capsule 0    Sig: TAKE 1 CAPSULE BY MOUTH THREE TIMES DAILY FOR GOUT      Analgesics:  NSAIDS Failed - 07/02/2020 10:16 AM      Failed - Cr in normal range and within 360 days    Creatinine, Ser  Date Value Ref Range Status  10/09/2019 1.46 (H) 0.76 - 1.27 mg/dL Final          Passed - HGB in normal range and within 360 days    Hemoglobin  Date Value Ref Range Status  10/09/2019 15.8 13.0 - 17.7 g/dL Final          Passed - Patient is not pregnant      Passed - Valid encounter within last 12 months    Recent Outpatient Visits           8 months ago Essential (primary) hypertension   [facility] Reubin Milan, MD   1 year ago Essential (primary) hypertension   [facility] Reubin Milan, MD   1 year ago DOE (dyspnea on exertion)   [facility] Reubin Milan, MD   1 year ago Essential (primary) hypertension   [facility] Reubin Milan, MD   2 years ago Medicare annual wellness visit, subsequent   [facility] Reubin Milan, MD

## 2020-07-06 ENCOUNTER — Encounter: Payer: Self-pay | Admitting: Internal Medicine

## 2020-07-06 DIAGNOSIS — I700 Atherosclerosis of aorta: Secondary | ICD-10-CM | POA: Insufficient documentation

## 2020-07-06 DIAGNOSIS — N1831 Chronic kidney disease, stage 3a: Secondary | ICD-10-CM | POA: Insufficient documentation

## 2020-07-07 ENCOUNTER — Ambulatory Visit (INDEPENDENT_AMBULATORY_CARE_PROVIDER_SITE_OTHER): Payer: Medicare HMO | Admitting: Internal Medicine

## 2020-07-07 ENCOUNTER — Other Ambulatory Visit: Payer: Self-pay

## 2020-07-07 ENCOUNTER — Encounter: Payer: Self-pay | Admitting: Internal Medicine

## 2020-07-07 VITALS — BP 170/110 | HR 81 | Temp 97.1°F | Ht 65.0 in | Wt 193.0 lb

## 2020-07-07 DIAGNOSIS — D869 Sarcoidosis, unspecified: Secondary | ICD-10-CM

## 2020-07-07 DIAGNOSIS — M1000 Idiopathic gout, unspecified site: Secondary | ICD-10-CM | POA: Diagnosis not present

## 2020-07-07 DIAGNOSIS — I10 Essential (primary) hypertension: Secondary | ICD-10-CM

## 2020-07-07 DIAGNOSIS — I700 Atherosclerosis of aorta: Secondary | ICD-10-CM

## 2020-07-07 DIAGNOSIS — N1831 Chronic kidney disease, stage 3a: Secondary | ICD-10-CM

## 2020-07-07 DIAGNOSIS — E785 Hyperlipidemia, unspecified: Secondary | ICD-10-CM

## 2020-07-07 DIAGNOSIS — M7071 Other bursitis of hip, right hip: Secondary | ICD-10-CM | POA: Diagnosis not present

## 2020-07-07 MED ORDER — AMLODIPINE BESYLATE 10 MG PO TABS: 10.0000 mg | ORAL_TABLET | Freq: Every day | ORAL | 1 refills | Status: DC

## 2020-07-07 MED ORDER — PREDNISONE 10 MG PO TABS: 10.0000 mg | ORAL_TABLET | ORAL | 0 refills | Status: AC

## 2020-07-07 MED ORDER — ALBUTEROL SULFATE HFA 108 (90 BASE) MCG/ACT IN AERS: 2.0000 | INHALATION_SPRAY | Freq: Four times a day (QID) | RESPIRATORY_TRACT | 5 refills | Status: DC | PRN

## 2020-07-07 MED ORDER — INDOMETHACIN 25 MG PO CAPS: ORAL_CAPSULE | ORAL | 0 refills | Status: DC

## 2020-07-07 MED ORDER — ATORVASTATIN CALCIUM 20 MG PO TABS: 20.0000 mg | ORAL_TABLET | Freq: Every day | ORAL | 1 refills | Status: DC

## 2020-07-07 MED ORDER — IRBESARTAN 300 MG PO TABS: 300.0000 mg | ORAL_TABLET | Freq: Every day | ORAL | 1 refills | Status: DC

## 2020-07-07 NOTE — Progress Notes (Signed)
Date:  07/07/2020   Name:  Edward Rogers   DOB:  Oct 04, 1945   MRN:  [id number]   Chief Complaint: Hypertension (f/u), Hyperlipidemia, and Leg Pain (X5 days, right leg, radiates down his leg )  Hypertension This is a chronic problem. The problem is resistant. Pertinent negatives include no chest pain, headaches, palpitations or shortness of breath. Past treatments include angiotensin blockers, direct vasodilators, calcium channel blockers and beta blockers. The current treatment provides moderate improvement. There are no compliance problems.   Hyperlipidemia This is a chronic problem. The problem is uncontrolled. Associated symptoms include myalgias (tight left shoulder/trapezius muscles). Pertinent negatives include no chest pain or shortness of breath. Current antihyperlipidemic treatment includes statins (he says that he is taking atorvastatin 20 mg).  Hip Pain  The incident occurred 5 to 7 days ago. There was no injury mechanism. The pain is present in the right hip. The quality of the pain is described as burning and aching. The pain is mild. The pain has been fluctuating since onset. Pertinent negatives include no numbness or tingling. He reports no foreign bodies present. The symptoms are aggravated by palpation. He has tried nothing for the symptoms.    Lab Results  Component Value Date   CREATININE 1.46 (H) 10/09/2019   BUN 17 10/09/2019   NA 142 10/09/2019   K 4.2 10/09/2019   CL 104 10/09/2019   CO2 21 10/09/2019   Lab Results  Component Value Date   CHOL 219 (H) 10/09/2019   HDL 26 (L) 10/09/2019   LDLCALC 129 (H) 10/09/2019   TRIG 354 (H) 10/09/2019   CHOLHDL 8.4 (H) 10/09/2019   Lab Results  Component Value Date   TSH 1.10 04/24/2014   No results found for: HGBA1C Lab Results  Component Value Date   WBC 4.5 10/09/2019   HGB 15.8 10/09/2019   HCT 47.8 10/09/2019   MCV 83 10/09/2019   PLT 242 10/09/2019   Lab Results  Component Value Date   ALT 18  10/09/2019   AST 22 10/09/2019   ALKPHOS 100 10/09/2019   BILITOT 0.3 10/09/2019     Review of Systems  Constitutional: Negative for chills, fatigue and fever.  Eyes: Negative for visual disturbance.  Respiratory: Negative for cough, chest tightness, shortness of breath and wheezing.   Cardiovascular: Negative for chest pain, palpitations and leg swelling.  Gastrointestinal: Negative for abdominal pain.  Musculoskeletal: Positive for arthralgias (right hip) and myalgias (tight left shoulder/trapezius muscles).  Neurological: Negative for dizziness, tingling, numbness and headaches.  Psychiatric/Behavioral: Negative for dysphoric mood and sleep disturbance. The patient is not nervous/anxious.     Patient Active Problem List   Diagnosis Date Noted  . Aortic atherosclerosis (HCC) 07/06/2020  . Chronic kidney disease, stage 3a (HCC) 07/06/2020  . 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  . 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 Chronic left-sided low back pain with left-sided sciatica 11/30/2016  . Gout 04/11/2015  . Dyslipidemia 04/11/2015  . Essential (primary) hypertension 04/11/2015  . Sarcoidosis 04/11/2015  . Other allergic rhinitis 04/11/2015  . Tobacco use disorder, moderate, in sustained remission 04/11/2015  . Elevated PSA 04/11/2015    No Known Allergies  Past Surgical History:  Procedure Laterality Date  . COLONOSCOPY  2009   normal  . PROSTATE BIOPSY  2014   normal    Social History   Tobacco Use  . Smoking status: Former Smoker    Packs/day: 0.50    Years: 30.00  Pack years: 15.00    Types: Cigarettes    Quit date: 08/30/2012    Years since quitting: 7.8  . Smokeless tobacco: Never Used  Substance Use Topics  . Alcohol use: Yes    Alcohol/week: 0.0 standard drinks    Comment: rarely  . Drug use: No     Medication list has been reviewed and updated.  Current Meds  Medication Sig  . albuterol (VENTOLIN HFA) 108 (90 Base) MCG/ACT  inhaler Inhale 2 puffs into the lungs every 6 (six) hours as needed for wheezing or shortness of breath.  . allopurinol (ZYLOPRIM) 100 MG tablet Take 2 tablets (200 mg total) by mouth daily.  Marland Kitchen amLODipine (NORVASC) 10 MG tablet Take 1 tablet (10 mg total) by mouth daily.  Marland Kitchen atorvastatin (LIPITOR) 20 MG tablet Take 1 tablet (20 mg total) by mouth at bedtime.  . colchicine 0.6 MG tablet Take 1 tablet (0.6 mg total) by mouth 2 (two) times daily as needed.  . fexofenadine (ALLEGRA) 180 MG tablet Take 180 mg by mouth daily.  . fluticasone (FLONASE) 50 MCG/ACT nasal spray Place 2 sprays into both nostrils daily.  . hydrALAZINE (APRESOLINE) 25 MG tablet Take 1 tablet (25 mg total) by mouth 3 (three) times daily.  . indomethacin (INDOCIN) 25 MG capsule TAKE 1 CAPSULE BY MOUTH THREE TIMES DAILY FOR GOUT  . irbesartan (AVAPRO) 300 MG tablet Take 1 tablet (300 mg total) by mouth daily.  . metoprolol succinate (TOPROL-XL) 100 MG 24 hr tablet Take 1 tablet (100 mg total) by mouth daily. Take with or immediately following a meal.  . montelukast (SINGULAIR) 10 MG tablet Take 1 tablet (10 mg total) by mouth at bedtime.  . [DISCONTINUED] albuterol (VENTOLIN HFA) 108 (90 Base) MCG/ACT inhaler SMARTSIG:2 Puff(s) By Mouth Every 6 Hours PRN  . [DISCONTINUED] amLODipine (NORVASC) 10 MG tablet Take 1 tablet by mouth once daily  . [DISCONTINUED] atorvastatin (LIPITOR) 20 MG tablet Take 1 tablet (20 mg total) by mouth at bedtime.  . [DISCONTINUED] indomethacin (INDOCIN) 25 MG capsule TAKE 1 CAPSULE BY MOUTH THREE TIMES DAILY FOR GOUT  . [DISCONTINUED] irbesartan (AVAPRO) 300 MG tablet Take 1 tablet by mouth once daily    PHQ 2/9 Scores 07/07/2020 10/09/2019 06/12/2019 10/31/2018  PHQ - 2 Score 0 4 1 0  PHQ- 9 Score 0 8 1 -    GAD 7 : Generalized Anxiety Score 07/07/2020  Nervous, Anxious, on Edge 0  Control/stop worrying 0  Worry too much - different things 0  Trouble relaxing 0  Restless 0  Easily annoyed or  irritable 0  Afraid - awful might happen 0  Total GAD 7 Score 0    BP Readings from Last 3 Encounters:  07/07/20 (!) 170/110  10/09/19 (!) 152/98  06/12/19 (!) 152/88    Physical Exam Vitals and nursing note reviewed.  Constitutional:      General: He is not in acute distress.    Appearance: Normal appearance. He is well-developed.  HENT:     Head: Normocephalic and atraumatic.  Neck:     Vascular: No carotid bruit.  Cardiovascular:     Rate and Rhythm: Normal rate and regular rhythm.     Pulses: Normal pulses.     Heart sounds: No murmur heard.   Pulmonary:     Effort: Pulmonary effort is normal. No respiratory distress.     Breath sounds: Examination of the right-lower field reveals wheezing. Wheezing present. No rhonchi.  Musculoskeletal:  Left shoulder: Tenderness (and spasm on palpation) present.     Cervical back: Normal range of motion.     Right hip: Tenderness present. Normal range of motion.     Left hip: Normal.     Right lower leg: No edema.     Left lower leg: No edema.  Lymphadenopathy:     Cervical: No cervical adenopathy.  Skin:    General: Skin is warm and dry.     Findings: No rash.  Neurological:     Mental Status: He is alert and oriented to person, place, and time.  Psychiatric:        Attention and Perception: Attention normal.        Mood and Affect: Mood normal.     Wt Readings from Last 3 Encounters:  07/07/20 193 lb (87.5 kg)  10/09/19 196 lb (88.9 kg)  06/12/19 202 lb (91.6 kg)    BP (!) 170/110   Pulse 81   Temp (!) 97.1 F (36.2 C) (Oral)   Ht 5\' 5"  (1.651 m)   Wt 193 lb (87.5 kg)   SpO2 97%   BMI 32.12 kg/m   Assessment and Plan: 1. Essential (primary) hypertension Not controlled; will increase hydralazine to 50 mg tid (pt will take 2 25 mg tabs until next appointment) He has seen cardiology in the past in 2020 - no new recommendations were made; - irbesartan (AVAPRO) 300 MG tablet; Take 1 tablet (300 mg total) by  mouth daily.  Dispense: 90 tablet; Refill: 1 - amLODipine (NORVASC) 10 MG tablet; Take 1 tablet (10 mg total) by mouth daily.  Dispense: 90 tablet; Refill: 1  2. Chronic kidney disease, stage 3a (HCC) Recheck labs and monitor at intervals in view of uncontrolled HTN and use of allopurinol - Comprehensive metabolic panel  3. Dyslipidemia Pt states that he is taking atorvastatin daily If lipids are still very high, will discuss changing to high dose Crestor next visit - Lipid panel - atorvastatin (LIPITOR) 20 MG tablet; Take 1 tablet (20 mg total) by mouth at bedtime.  Dispense: 90 tablet; Refill: 1  4. Idiopathic gout, unspecified chronicity, unspecified site Controlled - indomethacin (INDOCIN) 25 MG capsule; TAKE 1 CAPSULE BY MOUTH THREE TIMES DAILY FOR GOUT  Dispense: 16 capsule; Refill: 0  5. Bursitis of other bursa of right hip Steroid taper; heat or ice - predniSONE (DELTASONE) 10 MG tablet; Take 1 tablet (10 mg total) by mouth as directed for 6 days. Take 6,5,4,3,2,1 then stop  Dispense: 21 tablet; Refill: 0  6. Sarcoidosis Followed by Dr. 2021 - albuterol (VENTOLIN HFA) 108 (90 Base) MCG/ACT inhaler; Inhale 2 puffs into the lungs every 6 (six) hours as needed for wheezing or shortness of breath.  Dispense: 1 each; Refill: 5  7. Aortic atherosclerosis (HCC) On statin therapy -   Partially dictated using Meredeth Ide. Any errors are unintentional.  Animal nutritionist, MD [facility]  07/07/2020

## 2020-07-07 NOTE — Patient Instructions (Addendum)
Make sure that you are taking Atorvastatin 20 mg once a day (cholesterol).  I am increasing the hydralazine to 50 mg three times a day. (you can take 2 rather than one)

## 2020-07-08 ENCOUNTER — Other Ambulatory Visit: Payer: Self-pay | Admitting: Internal Medicine

## 2020-07-08 ENCOUNTER — Encounter: Payer: Self-pay | Admitting: Internal Medicine

## 2020-07-08 DIAGNOSIS — E785 Hyperlipidemia, unspecified: Secondary | ICD-10-CM

## 2020-07-08 LAB — LIPID PANEL
Chol/HDL Ratio: 8.6 ratio — ABNORMAL HIGH (ref 0.0–5.0)
Cholesterol, Total: 215 mg/dL — ABNORMAL HIGH (ref 100–199)
HDL: 25 mg/dL — ABNORMAL LOW (ref >39)
LDL Chol Calc (NIH): 134 mg/dL — ABNORMAL HIGH (ref 0–99)
Triglycerides: 309 mg/dL — ABNORMAL HIGH (ref 0–149)
VLDL Cholesterol Cal: 56 mg/dL — ABNORMAL HIGH (ref 5–40)

## 2020-07-08 LAB — COMPREHENSIVE METABOLIC PANEL
ALT: 25 IU/L (ref 0–44)
AST: 24 IU/L (ref 0–40)
Albumin/Globulin Ratio: 1.2 (ref 1.2–2.2)
Albumin: 4.1 g/dL (ref 3.7–4.7)
Alkaline Phosphatase: 91 IU/L (ref 44–121)
BUN/Creatinine Ratio: 13 (ref 10–24)
BUN: 21 mg/dL (ref 8–27)
Bilirubin Total: 0.4 mg/dL (ref 0.0–1.2)
CO2: 26 mmol/L (ref 20–29)
Calcium: 9.7 mg/dL (ref 8.6–10.2)
Chloride: 102 mmol/L (ref 96–106)
Creatinine, Ser: 1.64 mg/dL — ABNORMAL HIGH (ref 0.76–1.27)
GFR calc Af Amer: 46 mL/min/{1.73_m2} — ABNORMAL LOW (ref >59)
GFR calc non Af Amer: 40 mL/min/{1.73_m2} — ABNORMAL LOW (ref >59)
Globulin, Total: 3.5 g/dL (ref 1.5–4.5)
Glucose: 93 mg/dL (ref 65–99)
Potassium: 4.3 mmol/L (ref 3.5–5.2)
Sodium: 142 mmol/L (ref 134–144)
Total Protein: 7.6 g/dL (ref 6.0–8.5)

## 2020-07-08 MED ORDER — ROSUVASTATIN CALCIUM 40 MG PO TABS: 40.0000 mg | ORAL_TABLET | Freq: Every day | ORAL | 1 refills | Status: DC

## 2020-07-10 ENCOUNTER — Telehealth: Payer: Self-pay | Admitting: *Deleted

## 2020-07-10 NOTE — Chronic Care Management (AMB) (Signed)
  Chronic Care Management   Outreach Note  07/10/2020 Name: Carden Teel MRN: [id number] DOB: [birth year]  Brack Shaddock Sweetland is a 73 y.o. year old male who is a primary care patient of Reubin Milan, MD. I reached out to Leontine Locket Bedgood by phone today in response to a referral sent by Mr. Leontine Locket Stubbe's health plan.     An unsuccessful telephone outreach was attempted today. The patient was referred to the case management team for assistance with care management and care coordination.   Follow Up Plan: A HIPAA compliant phone message was left for the patient providing contact information and requesting a return call. The care management team will reach out to the patient again over the next 7 days. If patient returns call to provider office, please advise to call Embedded Care Management Care Guide Gwenevere Ghazi at [phone].  Gwenevere Ghazi  Care Guide, Embedded Care Coordination [facility] Management  Direct Dial: [phone]

## 2020-07-15 NOTE — Chronic Care Management (AMB) (Signed)
  Chronic Care Management   Note  07/15/2020 Name: Edward Rogers MRN: [id number] DOB: 09-Mar-1946  Edward Rogers is a 74 y.o. year old male who is a primary care patient of Glean Hess, MD. I reached out to Edward Rogers by phone today in response to a referral sent by Edward Rogers health plan.     Edward Rogers was given information about Chronic Care Management services today including:  1. CCM service includes personalized support from designated clinical staff supervised by his physician, including individualized plan of care and coordination with other care providers 2. 24/7 contact phone numbers for assistance for urgent and routine care needs. 3. Service will only be billed when office clinical staff spend 20 minutes or more in a month to coordinate care. 4. Only one practitioner may furnish and bill the service in a calendar month. 5. The patient may stop CCM services at any time (effective at the end of the month) by phone call to the office staff. 6. The patient will be responsible for cost sharing (co-pay) of up to 20% of the service fee (after annual deductible is met).  Patient wishes to consider information provided and/or speak with a member of the care team before deciding about enrollment in care management services.   Follow up plan: The care management team is available to follow up with the patient after provider conversation with the patient regarding recommendation for care management engagement and subsequent re-referral to the care management team.   [facility] Management

## 2020-07-15 NOTE — Chronic Care Management (AMB) (Signed)
  Chronic Care Management   Outreach Note  07/15/2020 Name: Edward Rogers MRN: [id number] DOB: 15-Nov-1945  Edward Rogers is a 74 y.o. year old male who is a primary care patient of Reubin Milan, MD. I reached out to [name] by phone today in response to a referral sent by Mr. [name]'s health plan.     A second unsuccessful telephone outreach was attempted today. The patient was referred to the case management team for assistance with care management and care coordination.   Follow Up Plan: A HIPAA compliant phone message was left for the patient providing contact information and requesting a return call. The care management team will reach out to the patient again over the next 7 days. If patient returns call to provider office, please advise to call Embedded Care Management Care Guide Gwenevere Ghazi at [phone].  Gwenevere Ghazi  Care Guide, Embedded Care Coordination [facility] Management

## 2020-07-16 ENCOUNTER — Other Ambulatory Visit: Payer: Self-pay

## 2020-07-16 ENCOUNTER — Encounter: Payer: Self-pay | Admitting: Internal Medicine

## 2020-07-16 ENCOUNTER — Telehealth: Payer: Self-pay

## 2020-07-16 DIAGNOSIS — G8929 Other chronic pain: Secondary | ICD-10-CM

## 2020-07-16 DIAGNOSIS — M25551 Pain in right hip: Secondary | ICD-10-CM

## 2020-07-16 NOTE — Progress Notes (Signed)
.  ortho

## 2020-07-16 NOTE — Telephone Encounter (Signed)
Patient send a mychart message so will send to Dr Judithann Graves for review and response.

## 2020-07-16 NOTE — Telephone Encounter (Signed)
Copied from CRM [id number]. Topic: General - Other >> Jul 16, 2020  9:37 AM Dalphine Handing A wrote: Patient is requesting a callback from Dr. Jaclynn Guarneri nurse in regards to if any new medication can be called in for his leg pain because he feels that the prednisone did not assist him. Please advise

## 2020-07-17 DIAGNOSIS — M5416 Radiculopathy, lumbar region: Secondary | ICD-10-CM | POA: Diagnosis not present

## 2020-07-20 ENCOUNTER — Ambulatory Visit: Payer: Medicare HMO | Admitting: Internal Medicine

## 2020-08-10 ENCOUNTER — Telehealth: Payer: Self-pay | Admitting: Internal Medicine

## 2020-08-10 NOTE — Telephone Encounter (Signed)
Left message for patient to call back and schedule Medicare Annual Wellness Visit (AWV) either virtually or in office. Whichever the patients preference is.  Last AWV 06/13/18; please schedule at anytime with [facility] Health Advisor.  This should be a 40 minute visit.

## 2020-08-11 DIAGNOSIS — M5416 Radiculopathy, lumbar region: Secondary | ICD-10-CM | POA: Diagnosis not present

## 2020-08-11 DIAGNOSIS — M5459 Other low back pain: Secondary | ICD-10-CM | POA: Diagnosis not present

## 2020-08-17 ENCOUNTER — Other Ambulatory Visit: Payer: Self-pay

## 2020-08-17 ENCOUNTER — Ambulatory Visit (INDEPENDENT_AMBULATORY_CARE_PROVIDER_SITE_OTHER): Payer: Medicare HMO | Admitting: Internal Medicine

## 2020-08-17 ENCOUNTER — Encounter: Payer: Self-pay | Admitting: Internal Medicine

## 2020-08-17 VITALS — BP 140/100 | HR 75 | Temp 97.6°F | Ht 65.0 in | Wt 196.0 lb

## 2020-08-17 DIAGNOSIS — I10 Essential (primary) hypertension: Secondary | ICD-10-CM | POA: Diagnosis not present

## 2020-08-17 DIAGNOSIS — E785 Hyperlipidemia, unspecified: Secondary | ICD-10-CM | POA: Diagnosis not present

## 2020-08-17 NOTE — Progress Notes (Signed)
Date:  08/17/2020   Name:  Edward Rogers   DOB:  07-02-1946   MRN:  [id number]   Chief Complaint: Hypertension  Hypertension This is a chronic problem. The problem is uncontrolled. Pertinent negatives include no chest pain, headaches, palpitations or shortness of breath. Past treatments include beta blockers, calcium channel blockers, angiotensin blockers and direct vasodilators (hydralazine increased last visit).  Hyperlipidemia This is a chronic problem. The problem is uncontrolled. Pertinent negatives include no chest pain or shortness of breath. Current antihyperlipidemic treatment includes statins (changed to Crestor last visit).    Lab Results  Component Value Date   CREATININE 1.64 (H) 07/07/2020   BUN 21 07/07/2020   NA 142 07/07/2020   K 4.3 07/07/2020   CL 102 07/07/2020   CO2 26 07/07/2020   Lab Results  Component Value Date   CHOL 215 (H) 07/07/2020   HDL 25 (L) 07/07/2020   LDLCALC 134 (H) 07/07/2020   TRIG 309 (H) 07/07/2020   CHOLHDL 8.6 (H) 07/07/2020   Lab Results  Component Value Date   TSH 1.10 04/24/2014   No results found for: HGBA1C Lab Results  Component Value Date   WBC 4.5 10/09/2019   HGB 15.8 10/09/2019   HCT 47.8 10/09/2019   MCV 83 10/09/2019   PLT 242 10/09/2019   Lab Results  Component Value Date   ALT 25 07/07/2020   AST 24 07/07/2020   ALKPHOS 91 07/07/2020   BILITOT 0.4 07/07/2020     Review of Systems  Constitutional: Negative for appetite change, fatigue and unexpected weight change.  Eyes: Negative for visual disturbance.  Respiratory: Negative for cough, shortness of breath and wheezing.   Cardiovascular: Negative for chest pain, palpitations and leg swelling.  Gastrointestinal: Negative for abdominal pain and blood in stool.  Genitourinary: Negative for dysuria and hematuria.  Musculoskeletal: Positive for arthralgias, back pain and gait problem.  Skin: Negative for color change and rash.  Neurological:  Negative for tremors, numbness and headaches.  Psychiatric/Behavioral: Negative for dysphoric mood.    Patient Active Problem List   Diagnosis Date Noted  . Aortic atherosclerosis (HCC) 07/06/2020  . Chronic kidney disease, stage 3a (HCC) 07/06/2020  . 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  . 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 Chronic left-sided low back pain with left-sided sciatica 11/30/2016  . Gout 04/11/2015  . Dyslipidemia 04/11/2015  . Essential (primary) hypertension 04/11/2015  . Sarcoidosis 04/11/2015  . Other allergic rhinitis 04/11/2015  . Tobacco use disorder, moderate, in sustained remission 04/11/2015  . Elevated PSA 04/11/2015    No Known Allergies  Past Surgical History:  Procedure Laterality Date  . COLONOSCOPY  2009   normal  . PROSTATE BIOPSY  2014   normal    Social History   Tobacco Use  . Smoking status: Former Smoker    Packs/day: 0.50    Years: 30.00    Pack years: 15.00    Types: Cigarettes    Quit date: 08/30/2012    Years since quitting: 7.9  . Smokeless tobacco: Never Used  Substance Use Topics  . Alcohol use: Yes    Alcohol/week: 0.0 standard drinks    Comment: rarely  . Drug use: No     Medication list has been reviewed and updated.  Current Meds  Medication Sig  . albuterol (VENTOLIN HFA) 108 (90 Base) MCG/ACT inhaler Inhale 2 puffs into the lungs every 6 (six) hours as needed for wheezing or shortness of breath.  Marland Kitchen  allopurinol (ZYLOPRIM) 100 MG tablet Take 2 tablets (200 mg total) by mouth daily.  Marland Kitchen amLODipine (NORVASC) 10 MG tablet Take 1 tablet (10 mg total) by mouth daily.  Marland Kitchen atorvastatin (LIPITOR) 20 MG tablet atorvastatin 20 mg tablet  . colchicine 0.6 MG tablet Take 1 tablet (0.6 mg total) by mouth 2 (two) times daily as needed.  . fexofenadine (ALLEGRA) 180 MG tablet Take 180 mg by mouth daily.  . fluticasone (FLONASE) 50 MCG/ACT nasal spray Place 2 sprays into both nostrils daily.  Marland Kitchen gabapentin  (NEURONTIN) 300 MG capsule gabapentin 300 mg capsule  . hydrALAZINE (APRESOLINE) 25 MG tablet Take 1 tablet (25 mg total) by mouth 3 (three) times daily.  . indomethacin (INDOCIN) 25 MG capsule TAKE 1 CAPSULE BY MOUTH THREE TIMES DAILY FOR GOUT (Patient taking differently: as needed. TAKE 1 CAPSULE BY MOUTH THREE TIMES DAILY FOR GOUT)  . irbesartan (AVAPRO) 300 MG tablet Take 1 tablet (300 mg total) by mouth daily.  . meloxicam (MOBIC) 15 MG tablet Take 15 mg by mouth daily.  . metoprolol succinate (TOPROL-XL) 100 MG 24 hr tablet Take 1 tablet (100 mg total) by mouth daily. Take with or immediately following a meal.  . montelukast (SINGULAIR) 10 MG tablet Take 1 tablet (10 mg total) by mouth at bedtime.  . rosuvastatin (CRESTOR) 40 MG tablet Take 1 tablet (40 mg total) by mouth daily.    PHQ 2/9 Scores 08/17/2020 07/07/2020 10/09/2019 06/12/2019  PHQ - 2 Score 0 0 4 1  PHQ- 9 Score 0 0 8 1    GAD 7 : Generalized Anxiety Score 08/17/2020 07/07/2020  Nervous, Anxious, on Edge 0 0  Control/stop worrying 0 0  Worry too much - different things 0 0  Trouble relaxing 0 0  Restless 0 0  Easily annoyed or irritable 0 0  Afraid - awful might happen 0 0  Total GAD 7 Score 0 0    BP Readings from Last 3 Encounters:  08/17/20 (!) 140/100  07/07/20 (!) 170/110  10/09/19 (!) 152/98    Physical Exam Vitals and nursing note reviewed.  Constitutional:      General: He is not in acute distress.    Appearance: He is well-developed.  HENT:     Head: Normocephalic and atraumatic.  Pulmonary:     Effort: Pulmonary effort is normal. No respiratory distress.  Musculoskeletal:        General: Normal range of motion.  Skin:    General: Skin is warm and dry.     Findings: No rash.  Neurological:     Mental Status: He is alert and oriented to person, place, and time.  Psychiatric:        Mood and Affect: Mood and affect normal.        Behavior: Behavior normal.        Thought Content: Thought  content normal.     Wt Readings from Last 3 Encounters:  08/17/20 196 lb (88.9 kg)  07/07/20 193 lb (87.5 kg)  10/09/19 196 lb (88.9 kg)    BP (!) 140/100   Pulse 75   Temp 97.6 F (36.4 C) (Oral)   Ht 5\' 5"  (1.651 m)   Wt 196 lb (88.9 kg)   SpO2 98%   BMI 32.62 kg/m   Assessment and Plan: 1. Essential (primary) hypertension Blood pressure improved but he did not increase hydralazine to 50 mg tid. He will make that change today.  Continue to monitor at home. Continue metoprolol,  avapro and amlodipine without change  2. Dyslipidemia Now on Crestor without side effects - Lipid panel - Hepatic function panel   Partially dictated using Dragon software. Any errors are unintentional.  Bari Edward, MD [facility]  08/17/2020

## 2020-08-17 NOTE — Patient Instructions (Signed)
Make sure to take Hydralazine 25 mg - two tablets three times a day.  Call me for a new prescription with the higher dose when you need a refill.

## 2020-09-02 DIAGNOSIS — M5416 Radiculopathy, lumbar region: Secondary | ICD-10-CM | POA: Diagnosis not present

## 2020-09-02 DIAGNOSIS — M5459 Other low back pain: Secondary | ICD-10-CM | POA: Diagnosis not present

## 2020-10-16 ENCOUNTER — Other Ambulatory Visit: Payer: Self-pay | Admitting: Internal Medicine

## 2020-10-16 DIAGNOSIS — M1000 Idiopathic gout, unspecified site: Secondary | ICD-10-CM

## 2020-10-16 NOTE — Telephone Encounter (Signed)
Requested Prescriptions  Pending Prescriptions Disp Refills  . colchicine 0.6 MG tablet [Pharmacy Med Name: Colchicine 0.6 MG Oral Tablet] 30 tablet 0    Sig: Take 1 tablet by mouth twice daily as needed     Endocrinology:  Gout Agents Failed - 10/16/2020 12:47 PM      Failed - Uric Acid in normal range and within 360 days    Uric Acid  Date Value Ref Range Status  10/09/2019 9.1 (H) 3.8 - 8.4 mg/dL Final    Comment:               Therapeutic target for gout patients: <6.0         Failed - Cr in normal range and within 360 days    Creatinine, Ser  Date Value Ref Range Status  07/07/2020 1.64 (H) 0.76 - 1.27 mg/dL Final         Passed - Valid encounter within last 12 months    Recent Outpatient Visits          2 months ago Essential (primary) hypertension   [facility] Reubin Milan, MD   3 months ago Essential (primary) hypertension   [facility] Reubin Milan, MD   1 year ago Essential (primary) hypertension   [facility] Reubin Milan, MD   1 year ago Essential (primary) hypertension   [facility] Reubin Milan, MD   1 year ago DOE (dyspnea on exertion)   [facility] Reubin Milan, MD      Future Appointments            In 2 months Judithann Graves Nyoka Cowden, MD [facility]

## 2020-10-26 ENCOUNTER — Other Ambulatory Visit: Payer: Self-pay | Admitting: Internal Medicine

## 2020-10-26 DIAGNOSIS — M1000 Idiopathic gout, unspecified site: Secondary | ICD-10-CM

## 2020-12-16 ENCOUNTER — Encounter: Payer: Medicare HMO | Admitting: Internal Medicine

## 2021-01-29 ENCOUNTER — Other Ambulatory Visit: Payer: Self-pay | Admitting: Internal Medicine

## 2021-01-29 DIAGNOSIS — M1000 Idiopathic gout, unspecified site: Secondary | ICD-10-CM

## 2021-01-29 DIAGNOSIS — J3089 Other allergic rhinitis: Secondary | ICD-10-CM

## 2021-01-29 NOTE — Telephone Encounter (Signed)
Patient needs an appointment for future refills.  Cancelled CPE 11/2020.  Please schedule.

## 2021-01-29 NOTE — Telephone Encounter (Signed)
Notes to clinic: review for continued use and refill of medications   Requested Prescriptions  Pending Prescriptions Disp Refills   fluticasone (FLONASE) 50 MCG/ACT nasal spray [Pharmacy Med Name: Fluticasone Propionate 50 MCG/ACT Nasal Suspension] 48 g 0    Sig: Use 2 spray(s) in each nostril once daily      Ear, Nose, and Throat: Nasal Preparations - Corticosteroids Passed - 01/29/2021 12:31 PM      Passed - Valid encounter within last 12 months    Recent Outpatient Visits           5 months ago Essential (primary) hypertension   [facility] Reubin Milan, MD   6 months ago Essential (primary) hypertension   [facility] Reubin Milan, MD   1 year ago Essential (primary) hypertension   [facility] Reubin Milan, MD   1 year ago Essential (primary) hypertension   [facility] Reubin Milan, MD   2 years ago DOE (dyspnea on exertion)   [facility] Reubin Milan, MD                  indomethacin (INDOCIN) 25 MG capsule [Pharmacy Med Name: Indomethacin 25 MG Oral Capsule] 16 capsule 0    Sig: TAKE 1 CAPSULE BY MOUTH THREE TIMES DAILY AS NEEDED FOR GOUT      Analgesics:  NSAIDS Failed - 01/29/2021 12:31 PM      Failed - Cr in normal range and within 360 days    Creatinine, Ser  Date Value Ref Range Status  07/07/2020 1.64 (H) 0.76 - 1.27 mg/dL Final          Failed - HGB in normal range and within 360 days    Hemoglobin  Date Value Ref Range Status  10/09/2019 15.8 13.0 - 17.7 g/dL Final          Passed - Patient is not pregnant      Passed - Valid encounter within last 12 months    Recent Outpatient Visits           5 months ago Essential (primary) hypertension   [facility] Reubin Milan, MD   6 months ago Essential (primary) hypertension   [facility] Reubin Milan, MD   1 year ago Essential (primary) hypertension   [facility] Reubin Milan, MD    1 year ago Essential (primary) hypertension   [facility] Reubin Milan, MD   2 years ago DOE (dyspnea on exertion)   [facility] Reubin Milan, MD                  colchicine 0.6 MG tablet [Pharmacy Med Name: Colchicine 0.6 MG Oral Tablet] 30 tablet 0    Sig: Take 1 tablet by mouth twice daily as needed      Endocrinology:  Gout Agents Failed - 01/29/2021 12:31 PM      Failed - Uric Acid in normal range and within 360 days    Uric Acid  Date Value Ref Range Status  10/09/2019 9.1 (H) 3.8 - 8.4 mg/dL Final    Comment:               Therapeutic target for gout patients: <6.0          Failed - Cr in normal range and within 360 days    Creatinine, Ser  Date Value Ref Range Status  07/07/2020 1.64 (H)  0.76 - 1.27 mg/dL Final          Passed - Valid encounter within last 12 months    Recent Outpatient Visits           5 months ago Essential (primary) hypertension   [facility] Reubin Milan, MD   6 months ago Essential (primary) hypertension   [facility] Reubin Milan, MD   1 year ago Essential (primary) hypertension   [facility] Reubin Milan, MD   1 year ago Essential (primary) hypertension   [facility] Reubin Milan, MD   2 years ago DOE (dyspnea on exertion)   [facility] Reubin Milan, MD

## 2021-02-01 NOTE — Telephone Encounter (Signed)
Left voicemail to have patient set up appointment for further refills 

## 2021-02-04 ENCOUNTER — Telehealth: Payer: Self-pay | Admitting: Internal Medicine

## 2021-02-04 NOTE — Telephone Encounter (Signed)
Copied from CRM [id number]. Topic: Medicare AWV >> Feb 04, 2021  2:22 PM Claudette Laws R wrote: Reason for CRM:  No answer unable to leave a message for patient to call back and schedule Medicare Annual Wellness Visit (AWV) in office.   If unable to come into the office for AWV,  please offer to do virtually or by telephone.  Last AWV: 05/2018  Please schedule at anytime with [facility] Health Advisor.  40 minute appointment  Any questions, please contact me at [phone]

## 2021-02-12 ENCOUNTER — Ambulatory Visit (INDEPENDENT_AMBULATORY_CARE_PROVIDER_SITE_OTHER): Payer: Medicare HMO | Admitting: Internal Medicine

## 2021-02-12 ENCOUNTER — Encounter: Payer: Self-pay | Admitting: Internal Medicine

## 2021-02-12 ENCOUNTER — Other Ambulatory Visit: Payer: Self-pay

## 2021-02-12 VITALS — BP 160/98 | HR 89 | Temp 98.2°F | Ht 65.0 in | Wt 194.0 lb

## 2021-02-12 DIAGNOSIS — J3089 Other allergic rhinitis: Secondary | ICD-10-CM

## 2021-02-12 DIAGNOSIS — M1000 Idiopathic gout, unspecified site: Secondary | ICD-10-CM | POA: Diagnosis not present

## 2021-02-12 DIAGNOSIS — I10 Essential (primary) hypertension: Secondary | ICD-10-CM

## 2021-02-12 DIAGNOSIS — E785 Hyperlipidemia, unspecified: Secondary | ICD-10-CM | POA: Diagnosis not present

## 2021-02-12 DIAGNOSIS — M5416 Radiculopathy, lumbar region: Secondary | ICD-10-CM

## 2021-02-12 MED ORDER — INDOMETHACIN 25 MG PO CAPS: 25.0000 mg | ORAL_CAPSULE | ORAL | 0 refills | Status: DC | PRN

## 2021-02-12 MED ORDER — GABAPENTIN 300 MG PO CAPS: 300.0000 mg | ORAL_CAPSULE | Freq: Every day | ORAL | 1 refills | Status: DC

## 2021-02-12 MED ORDER — COLCHICINE 0.6 MG PO TABS: 0.6000 mg | ORAL_TABLET | Freq: Two times a day (BID) | ORAL | 0 refills | Status: DC | PRN

## 2021-02-12 MED ORDER — CLONIDINE HCL 0.1 MG PO TABS: 0.1000 mg | ORAL_TABLET | Freq: Two times a day (BID) | ORAL | 1 refills | Status: DC

## 2021-02-12 MED ORDER — FLUTICASONE PROPIONATE 50 MCG/ACT NA SUSP: 2.0000 | Freq: Every day | NASAL | 1 refills | Status: DC

## 2021-02-12 NOTE — Patient Instructions (Signed)
Stop Hydralazine  Start Clonidine twice a day

## 2021-02-12 NOTE — Progress Notes (Signed)
Date:  02/12/2021   Name:  Edward Rogers   DOB:  [birth year]   MRN:  [id number]   Chief Complaint: Hypertension  Hypertension This is a chronic problem. The problem is resistant. Pertinent negatives include no chest pain, headaches, palpitations or shortness of breath. Past treatments include calcium channel blockers, direct vasodilators, angiotensin blockers and beta blockers (hydralazine increased to TID but he says he can not remember to take a mid day dose). Hypertensive end-organ damage includes kidney disease. There is no history of CAD/MI or CVA.  Back Pain This is a recurrent problem. The problem has been gradually improving since onset. The pain is present in the lumbar spine. The quality of the pain is described as aching. The pain radiates to the left knee and right knee. The pain is mild. Pertinent negatives include no abdominal pain, chest pain, headaches or weakness. He has tried NSAIDs (ortho did xray - degen disc) for the symptoms.  Gout - he has recurrent symptoms of gout for which he takes Allopurinol daily and then PRN indocin or colchicine. Lab Results  Component Value Date   CREATININE 1.64 (H) 07/07/2020   BUN 21 07/07/2020   NA 142 07/07/2020   K 4.3 07/07/2020   CL 102 07/07/2020   CO2 26 07/07/2020   Lab Results  Component Value Date   CHOL 215 (H) 07/07/2020   HDL 25 (L) 07/07/2020   LDLCALC 134 (H) 07/07/2020   TRIG 309 (H) 07/07/2020   CHOLHDL 8.6 (H) 07/07/2020   Lab Results  Component Value Date   TSH 1.10 04/24/2014   No results found for: HGBA1C Lab Results  Component Value Date   WBC 4.5 10/09/2019   HGB 15.8 10/09/2019   HCT 47.8 10/09/2019   MCV 83 10/09/2019   PLT 242 10/09/2019   Lab Results  Component Value Date   ALT 25 07/07/2020   AST 24 07/07/2020   ALKPHOS 91 07/07/2020   BILITOT 0.4 07/07/2020     Review of Systems  Constitutional:  Negative for fatigue and unexpected weight change.  HENT:  Negative for  nosebleeds.   Eyes:  Negative for visual disturbance.  Respiratory:  Negative for cough, chest tightness, shortness of breath and wheezing.   Cardiovascular:  Negative for chest pain, palpitations and leg swelling.  Gastrointestinal:  Negative for abdominal pain, constipation and diarrhea.  Musculoskeletal:  Positive for arthralgias and back pain.  Neurological:  Negative for dizziness, weakness, light-headedness and headaches.   Patient Active Problem List   Diagnosis Date Noted   Lumbar radiculopathy 07/17/2020   Aortic atherosclerosis (HCC) 07/06/2020   Chronic kidney disease, stage 3a (HCC) 07/06/2020   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   Arthritis of joint of toe 06/01/2017   Chronic left-sided low back pain with left-sided sciatica 11/30/2016   Gout 04/11/2015   Dyslipidemia 04/11/2015   Essential (primary) hypertension 04/11/2015   Sarcoidosis 04/11/2015   Other allergic rhinitis 04/11/2015   Tobacco use disorder, moderate, in sustained remission 04/11/2015   Elevated PSA 04/11/2015    No Known Allergies  Past Surgical History:  Procedure Laterality Date   COLONOSCOPY  2009   normal   PROSTATE BIOPSY  2014   normal    Social History   Tobacco Use   Smoking status: Former    Packs/day: 0.50    Years: 30.00    Pack years: 15.00    Types: Cigarettes    Quit date: 08/30/2012    Years since  quitting: 8.4   Smokeless tobacco: Never  Substance Use Topics   Alcohol use: Yes    Alcohol/week: 0.0 standard drinks    Comment: rarely   Drug use: No     Medication list has been reviewed and updated.  Current Meds  Medication Sig   albuterol (VENTOLIN HFA) 108 (90 Base) MCG/ACT inhaler Inhale 2 puffs into the lungs every 6 (six) hours as needed for wheezing or shortness of breath.   allopurinol (ZYLOPRIM) 100 MG tablet Take 2 tablets by mouth once daily   amLODipine (NORVASC) 10 MG tablet Take 1 tablet (10 mg total) by mouth daily.   atorvastatin  (LIPITOR) 20 MG tablet atorvastatin 20 mg tablet   cloNIDine (CATAPRES) 0.1 MG tablet Take 1 tablet (0.1 mg total) by mouth 2 (two) times daily.   irbesartan (AVAPRO) 300 MG tablet Take 1 tablet (300 mg total) by mouth daily.   meloxicam (MOBIC) 15 MG tablet Take 15 mg by mouth daily.   metoprolol succinate (TOPROL-XL) 100 MG 24 hr tablet Take 1 tablet (100 mg total) by mouth daily. Take with or immediately following a meal.   montelukast (SINGULAIR) 10 MG tablet Take 1 tablet (10 mg total) by mouth at bedtime.   rosuvastatin (CRESTOR) 40 MG tablet Take 1 tablet (40 mg total) by mouth daily.   [DISCONTINUED] colchicine 0.6 MG tablet Take 1 tablet by mouth twice daily as needed (Patient taking differently: No sig reported)   [DISCONTINUED] fluticasone (FLONASE) 50 MCG/ACT nasal spray Place 2 sprays into both nostrils daily.   [DISCONTINUED] gabapentin (NEURONTIN) 300 MG capsule gabapentin 300 mg capsule   [DISCONTINUED] hydrALAZINE (APRESOLINE) 25 MG tablet Take 1 tablet (25 mg total) by mouth 3 (three) times daily. (Patient taking differently: Take 50 mg by mouth 3 (three) times daily.)   [DISCONTINUED] indomethacin (INDOCIN) 25 MG capsule Take 1 capsule (25 mg total) by mouth as needed. TAKE 1 CAPSULE BY MOUTH THREE TIMES DAILY FOR GOUT    PHQ 2/9 Scores 02/12/2021 08/17/2020 07/07/2020 10/09/2019  PHQ - 2 Score 0 0 0 4  PHQ- 9 Score 0 0 0 8    GAD 7 : Generalized Anxiety Score 02/12/2021 08/17/2020 07/07/2020  Nervous, Anxious, on Edge 0 0 0  Control/stop worrying 0 0 0  Worry too much - different things 0 0 0  Trouble relaxing 0 0 0  Restless 0 0 0  Easily annoyed or irritable 0 0 0  Afraid - awful might happen 0 0 0  Total GAD 7 Score 0 0 0    BP Readings from Last 3 Encounters:  02/12/21 (!) 160/98  08/17/20 (!) 140/100  07/07/20 (!) 170/110    Physical Exam Vitals and nursing note reviewed.  Constitutional:      General: He is not in acute distress.    Appearance: He is  well-developed.  HENT:     Head: Normocephalic and atraumatic.  Cardiovascular:     Rate and Rhythm: Normal rate and regular rhythm.     Pulses: Normal pulses.  Pulmonary:     Effort: Pulmonary effort is normal. No respiratory distress.     Breath sounds: No wheezing or rhonchi.  Musculoskeletal:     Right lower leg: No edema.     Left lower leg: No edema.  Skin:    General: Skin is warm and dry.     Findings: No rash.  Neurological:     Mental Status: He is alert and oriented to person, place, and time.  Psychiatric:        Mood and Affect: Mood normal.        Behavior: Behavior normal.    Wt Readings from Last 3 Encounters:  02/12/21 194 lb (88 kg)  08/17/20 196 lb (88.9 kg)  07/07/20 193 lb (87.5 kg)    BP (!) 160/98   Pulse 89   Temp 98.2 F (36.8 C) (Oral)   Ht 5\' 5"  (1.651 m)   Wt 194 lb (88 kg)   SpO2 98%   BMI 32.28 kg/m   Assessment and Plan: 1. Essential (primary) hypertension BP remains elevated despite multiple medications.  He can not comply with tid hydralazine so will discontinue and start clonidine bid. Follow up in 2 months - cloNIDine (CATAPRES) 0.1 MG tablet; Take 1 tablet (0.1 mg total) by mouth 2 (two) times daily.  Dispense: 180 tablet; Refill: 1  2. Idiopathic gout, unspecified chronicity, unspecified site Frequent flares requiring treatment - indomethacin (INDOCIN) 25 MG capsule; Take 1 capsule (25 mg total) by mouth as needed. TAKE 1 CAPSULE BY MOUTH THREE TIMES DAILY FOR GOUT  Dispense: 16 capsule; Refill: 0 - colchicine 0.6 MG tablet; Take 1 tablet (0.6 mg total) by mouth 2 (two) times daily as needed.  Dispense: 30 tablet; Refill: 0  3. Environmental and seasonal allergies - fluticasone (FLONASE) 50 MCG/ACT nasal spray; Place 2 sprays into both nostrils daily.  Dispense: 48 g; Refill: 1  4. Lumbar radiculopathy Seen several months ago by Ortho Prescribed PT which helped somewhat.  He continues home exercise. He was prescribed Mobic  which helped but was not aware of gabapentin Rx. Since back has flared again, will try gabapentin as intended originally - gabapentin (NEURONTIN) 300 MG capsule; Take 1 capsule (300 mg total) by mouth at bedtime.  Dispense: 90 capsule; Refill: 1  5. Dyslipidemia On Crestor (changed from atorvastatin last visit) Will need labs at [facility] in near future.   Partially dictated using . Any errors are unintentional.  Animal nutritionist, MD [facility]  02/12/2021

## 2021-03-08 ENCOUNTER — Ambulatory Visit (INDEPENDENT_AMBULATORY_CARE_PROVIDER_SITE_OTHER): Payer: Medicare HMO

## 2021-03-08 DIAGNOSIS — Z0000 Encounter for general adult medical examination without abnormal findings: Secondary | ICD-10-CM

## 2021-03-08 NOTE — Progress Notes (Signed)
Subjective:   Edward Rogers is a 75 y.o. male who presents for Medicare Annual/Subsequent preventive  examination.  Virtual Visit via Telephone Note  I connected with  Edward Rogers on 03/08/21 at 10:00 AM EDT by telephone and verified that I am speaking with the correct person using two identifiers.  Location: Patient: home Provider: [facility] Persons participating in the virtual visit: patient/Nurse Health Advisor   I discussed the limitations, risks, security and privacy concerns of performing an evaluation and management service by telephone and the availability of in person appointments. The patient expressed understanding and agreed to proceed.  Interactive audio and video telecommunications were attempted between this nurse and patient, however failed, due to patient having technical difficulties OR patient did not have access to video capability.  We continued and completed visit with audio only.  Some vital signs may be absent or patient reported.   Reather Littler, LPN   Review of Systems     Cardiac Risk Factors include: advanced age (>42men, >69 women);dyslipidemia;male gender;hypertension;obesity (BMI >30kg/m2)     Objective:    There were no vitals filed for this visit. There is no height or weight on file to calculate BMI.  Advanced Directives 03/08/2021 11/30/2016 11/04/2015  Does Patient Have a Medical Advance Directive? No No No  Would patient like information on creating a medical advance directive? No - Patient declined - No - patient declined information    Current Medications (verified) Outpatient Encounter Medications as of 03/08/2021  Medication Sig   albuterol (VENTOLIN HFA) 108 (90 Base) MCG/ACT inhaler Inhale 2 puffs into the lungs every 6 (six) hours as needed for wheezing or shortness of breath.   allopurinol (ZYLOPRIM) 100 MG tablet Take 2 tablets by mouth once daily   amLODipine (NORVASC) 10 MG tablet Take 1 tablet (10 mg total) by mouth  daily.   cloNIDine (CATAPRES) 0.1 MG tablet Take 1 tablet (0.1 mg total) by mouth 2 (two) times daily.   colchicine 0.6 MG tablet Take 1 tablet (0.6 mg total) by mouth 2 (two) times daily as needed.   fluticasone (FLONASE) 50 MCG/ACT nasal spray Place 2 sprays into both nostrils daily.   gabapentin (NEURONTIN) 300 MG capsule Take 1 capsule (300 mg total) by mouth at bedtime.   irbesartan (AVAPRO) 300 MG tablet Take 1 tablet (300 mg total) by mouth daily.   montelukast (SINGULAIR) 10 MG tablet Take 1 tablet (10 mg total) by mouth at bedtime.   naproxen sodium (ALEVE) 220 MG tablet Take 220 mg by mouth daily as needed.   rosuvastatin (CRESTOR) 40 MG tablet Take 1 tablet (40 mg total) by mouth daily.   indomethacin (INDOCIN) 25 MG capsule Take 1 capsule (25 mg total) by mouth as needed. TAKE 1 CAPSULE BY MOUTH THREE TIMES DAILY FOR GOUT (Patient not taking: Reported on 03/08/2021)   [DISCONTINUED] atorvastatin (LIPITOR) 20 MG tablet atorvastatin 20 mg tablet   [DISCONTINUED] meloxicam (MOBIC) 15 MG tablet Take 15 mg by mouth daily.   [DISCONTINUED] metoprolol succinate (TOPROL-XL) 100 MG 24 hr tablet Take 1 tablet (100 mg total) by mouth daily. Take with or immediately following a meal.   No facility-administered encounter medications on file as of 03/08/2021.    Allergies (verified) Patient has no known allergies.   History: Past Medical History:  Diagnosis Date   Hypertension    Past Surgical History:  Procedure Laterality Date   COLONOSCOPY  2009   normal   PROSTATE BIOPSY  2014  normal   Family History  Problem Relation Age of Onset   Cancer Mother    Heart failure Father    Social History   Socioeconomic History   Marital status: Single    Spouse name: Not on file   Number of children: 3   Years of education: Not on file   Highest education level: High school graduate  Occupational History   Not on file  Tobacco Use   Smoking status: Former    Packs/day: 0.50     Years: 30.00    Pack years: 15.00    Types: Cigarettes    Quit date: 08/30/2012    Years since quitting: 8.5   Smokeless tobacco: Never  Vaping Use   Vaping Use: Never used  Substance and Sexual Activity   Alcohol use: Yes    Alcohol/week: 0.0 standard drinks    Comment: rarely   Drug use: No   Sexual activity: Not on file  Other Topics Concern   Not on file  Social History Narrative   Pt lives alone   Social Determinants of Health   Financial Resource Strain: Low Risk    Difficulty of Paying Living Expenses: Not very hard  Food Insecurity: No Food Insecurity   Worried About Programme researcher, broadcasting/film/video in the Last Year: Never true   Ran Out of Food in the Last Year: Never true  Transportation Needs: No Transportation Needs   Lack of Transportation (Medical): No   Lack of Transportation (Non-Medical): No  Physical Activity: Insufficiently Active   Days of Exercise per Week: 2 days   Minutes of Exercise per Session: 10 min  Stress: No Stress Concern Present   Feeling of Stress : Not at all  Social Connections: Moderately Isolated   Frequency of Communication with Friends and Family: More than three times a week   Frequency of Social Gatherings with Friends and Family: More than three times a week   Attends Religious Services: More than 4 times per year   Active Member of [employer] or Organizations: No   Attends Engineer, structural: Never   Marital Status: Divorced    Tobacco Counseling Counseling given: Not Answered   Clinical Intake:  Pre-visit preparation completed: Yes  Pain : No/denies pain     Nutritional Risks: None Diabetes: No  How often do you need to have someone help you when you read instructions, pamphlets, or other written materials from your doctor or pharmacy?: 1 - Never    Interpreter Needed?: No  Information entered by :: Reather Littler LPN   Activities of Daily Living In your present state of health, do you have any difficulty performing  the following activities: 03/08/2021 02/12/2021  Hearing? N N  Comment declines hearing aids -  Vision? N N  Difficulty concentrating or making decisions? N N  Walking or climbing stairs? N N  Dressing or bathing? N N  Doing errands, shopping? N N  Preparing Food and eating ? N -  Using the Toilet? N -  In the past six months, have you accidently leaked urine? N -  Do you have problems with loss of bowel control? N -  Managing your Medications? N -  Managing your Finances? N -  Housekeeping or managing your Housekeeping? N -  Some recent data might be hidden    Patient Care Team: Reubin Milan, MD as PCP - General (Internal Medicine) Harle Battiest PA-C as Physician Assistant (Urology)  Indicate any recent Medical Services  you may have received from other than [facility] providers in the past year (date may be approximate).     Assessment:   This is a routine wellness examination for Edward Rogers.  Hearing/Vision screen Hearing Screening - Comments:: Pt has no difficulty hearing. Vision Screening - Comments:: Past due for eye exam; last seen by [facility]  Dietary issues and exercise activities discussed: Current Exercise Habits: Home exercise routine, Type of exercise: Other - see comments (exercise bike), Time (Minutes): 10, Frequency (Times/Week): 2, Weekly Exercise (Minutes/Week): 20, Intensity: Mild, Exercise limited by: orthopedic condition(s)   Goals Addressed             This Visit's Progress    DIET - INCREASE WATER INTAKE       Recommend drinking 6-8 glasses of water per day         Depression Screen PHQ 2/9 Scores 03/08/2021 02/12/2021 08/17/2020 07/07/2020 10/09/2019 06/12/2019 10/31/2018  PHQ - 2 Score 0 0 0 0 4 1 0  PHQ- 9 Score - 0 0 0 8 1 -    Fall Risk Fall Risk  03/08/2021 02/12/2021 08/17/2020 07/07/2020 10/09/2019  Falls in the past year? 0 0 0 0 0  Number falls in past yr: 0 - - - 0  Injury with Fall? 0 - - - 0  Risk for fall due to : No  Fall Risks - - - -  Follow up Falls prevention discussed Falls evaluation completed Falls evaluation completed Falls evaluation completed Falls evaluation completed    FALL RISK PREVENTION PERTAINING TO THE HOME:  Any stairs in or around the home? Yes  If so, are there any without handrails? No  Home free of loose throw rugs in walkways, pet beds, electrical cords, etc? Yes  Adequate lighting in your home to reduce risk of falls? Yes   ASSISTIVE DEVICES UTILIZED TO PREVENT FALLS:  Life alert? No  Use of a cane, walker or w/c? No  Grab bars in the bathroom? No  Shower chair or bench in shower? No  Elevated toilet seat or a handicapped toilet? No   TIMED UP AND GO:  Was the test performed? No . Telephonic visit.   Cognitive Function: Normal cognitive status assessed by direct observation by this Nurse Health Advisor. No abnormalities found.       6CIT Screen 06/13/2018 11/30/2016  What Year? 0 points 0 points  What month? 0 points 0 points  What time? 0 points 0 points  Count back from 20 0 points 0 points  Months in reverse 0 points 0 points  Repeat phrase 2 points 2 points  Total Score 2 2    Immunizations Immunization History  Administered Date(s) Administered   Moderna Sars-Covid-2 Vaccination 11/23/2019, 12/26/2019, 09/08/2020   Pneumococcal Conjugate-13 11/04/2015   Pneumococcal Polysaccharide-23 08/31/2007, 06/13/2018   Zoster, Live 02/11/2008    TDAP status: Due, Education has been provided regarding the importance of this vaccine. Advised may receive this vaccine at local pharmacy or Health Dept. Aware to provide a copy of the vaccination record if obtained from local pharmacy or Health Dept. Verbalized acceptance and understanding.  Flu Vaccine status: Declined, Education has been provided regarding the importance of this vaccine but patient still declined. Advised may receive this vaccine at local pharmacy or Health Dept. Aware to provide a copy of the  vaccination record if obtained from local pharmacy or Health Dept. Verbalized acceptance and understanding.  Pneumococcal vaccine status: Up to date  Covid-19 vaccine status: Completed vaccines  Qualifies for Shingles Vaccine? Yes   Zostavax completed Yes   Shingrix Completed?: No.    Education has been provided regarding the importance of this vaccine. Patient has been advised to call insurance company to determine out of pocket expense if they have not yet received this vaccine. Advised may also receive vaccine at local pharmacy or Health Dept. Verbalized acceptance and understanding.  Screening Tests Health Maintenance  Topic Date Due   Zoster Vaccines- Shingrix (1 of 2) Never done   COVID-19 Vaccine (4 - Booster for Moderna series) 01/06/2021   TETANUS/TDAP  07/07/2021 (Originally 12/29/1964)   INFLUENZA VACCINE  03/29/2021   Hepatitis C Screening  Completed   PNA vac Low Risk Adult  Completed   HPV VACCINES  Aged Out    Health Maintenance  Health Maintenance Due  Topic Date Due   Zoster Vaccines- Shingrix (1 of 2) Never done   COVID-19 Vaccine (4 - Booster for Moderna series) 01/06/2021    Colorectal cancer screening: No longer required.   Lung Cancer Screening: (Low Dose CT Chest recommended if Age 77-80 years, 30 pack-year currently smoking OR have quit w/in 15years.) does not qualify.   Additional Screening:  Hepatitis C Screening: does qualify; Completed 06/13/18  Vision Screening: Recommended annual ophthalmology exams for early detection of glaucoma and other disorders of the eye. Is the patient up to date with their annual eye exam?  No  Who is the provider or what is the name of the office in which the patient attends annual eye exams? Not established If pt is not established with a provider, would they like to be referred to a provider to establish care? No .   Dental Screening: Recommended annual dental exams for proper oral hygiene  Community Resource  Referral / Chronic Care Management: CRR required this visit?  No   CCM required this visit?  No      Plan:     I have personally reviewed and noted the following in the patient's chart:   Medical and social history Use of alcohol, tobacco or illicit drugs  Current medications and supplements including opioid prescriptions. Patient is not currently taking opioid prescriptions. Functional ability and status Nutritional status Physical activity Advanced directives List of other physicians Hospitalizations, surgeries, and ER visits in previous 12 months Vitals Screenings to include cognitive, depression, and falls Referrals and appointments  In addition, I have reviewed and discussed with patient certain preventive protocols, quality metrics, and best practice recommendations. A written personalized care plan for preventive services as well as general preventive health recommendations were provided to patient.     Reather LittlerKasey Jamian Andujo, LPN   4/09/81197/06/2021   Nurse Notes: none

## 2021-03-08 NOTE — Patient Instructions (Signed)
Edward Rogers , Thank you for taking time to come for your Medicare Wellness Visit. I appreciate your ongoing commitment to your health goals. Please review the following plan we discussed and let me know if I can assist you in the future.   Screening recommendations/referrals: Colonoscopy: no longer required Recommended yearly ophthalmology/optometry visit for glaucoma screening and checkup Recommended yearly dental visit for hygiene and checkup  Vaccinations: Influenza vaccine: declined Pneumococcal vaccine: done 11/04/15 Tdap vaccine: due Shingles vaccine: Shingrix discussed. Please contact your pharmacy for coverage information.  Covid-19:  done 11/23/19, 12/26/19 & 09/08/20  Advanced directives: Advance directive discussed with you today. Even though you declined this today please call our office should you change your mind and we can give you the proper paperwork for you to fill out.   Conditions/risks identified: recommend drinking 6-8 glasses of water per day   Next appointment: Follow up in one year for your annual wellness visit.   Preventive Care 21 Years and Older, Male Preventive care refers to lifestyle choices and visits with your health care provider that can promote health and wellness. What does preventive care include? A yearly physical exam. This is also called an annual well check. Dental exams once or twice a year. Routine eye exams. Ask your health care provider how often you should have your eyes checked. Personal lifestyle choices, including: Daily care of your teeth and gums. Regular physical activity. Eating a healthy diet. Avoiding tobacco and drug use. Limiting alcohol use. Practicing safe sex. Taking low doses of aspirin every day. Taking vitamin and mineral supplements as recommended by your health care provider. What happens during an annual well check? The services and screenings done by your health care provider during your annual well check will depend  on your age, overall health, lifestyle risk factors, and family history of disease. Counseling  Your health care provider may ask you questions about your: Alcohol use. Tobacco use. Drug use. Emotional well-being. Home and relationship well-being. Sexual activity. Eating habits. History of falls. Memory and ability to understand (cognition). Work and work Astronomer. Screening  You may have the following tests or measurements: Height, weight, and BMI. Blood pressure. Lipid and cholesterol levels. These may be checked every 5 years, or more frequently if you are over 38 years old. Skin check. Lung cancer screening. You may have this screening every year starting at age 33 if you have a 30-pack-year history of smoking and currently smoke or have quit within the past 15 years. Fecal occult blood test (FOBT) of the stool. You may have this test every year starting at age 29. Flexible sigmoidoscopy or colonoscopy. You may have a sigmoidoscopy every 5 years or a colonoscopy every 10 years starting at age 10. Prostate cancer screening. Recommendations will vary depending on your family history and other risks. Hepatitis C blood test. Hepatitis B blood test. Sexually transmitted disease (STD) testing. Diabetes screening. This is done by checking your blood sugar (glucose) after you have not eaten for a while (fasting). You may have this done every 1-3 years. Abdominal aortic aneurysm (AAA) screening. You may need this if you are a current or former smoker. Osteoporosis. You may be screened starting at age 55 if you are at high risk. Talk with your health care provider about your test results, treatment options, and if necessary, the need for more tests. Vaccines  Your health care provider may recommend certain vaccines, such as: Influenza vaccine. This is recommended every year. Tetanus, diphtheria, and acellular  pertussis (Tdap, Td) vaccine. You may need a Td booster every 10  years. Zoster vaccine. You may need this after age 87. Pneumococcal 13-valent conjugate (PCV13) vaccine. One dose is recommended after age 51. Pneumococcal polysaccharide (PPSV23) vaccine. One dose is recommended after age 65. Talk to your health care provider about which screenings and vaccines you need and how often you need them. This information is not intended to replace advice given to you by your health care provider. Make sure you discuss any questions you have with your health care provider. Document Released: 09/11/2015 Document Revised: 05/04/2016 Document Reviewed: 06/16/2015 Elsevier Interactive Patient Education  2017 [place] Prevention in the Home Falls can cause injuries. They can happen to people of all ages. There are many things you can do to make your home safe and to help prevent falls. What can I do on the outside of my home? Regularly fix the edges of walkways and driveways and fix any cracks. Remove anything that might make you trip as you walk through a door, such as a raised step or threshold. Trim any bushes or trees on the path to your home. Use bright outdoor lighting. Clear any walking paths of anything that might make someone trip, such as rocks or tools. Regularly check to see if handrails are loose or broken. Make sure that both sides of any steps have handrails. Any raised decks and porches should have guardrails on the edges. Have any leaves, snow, or ice cleared regularly. Use sand or salt on walking paths during winter. Clean up any spills in your garage right away. This includes oil or grease spills. What can I do in the bathroom? Use night lights. Install grab bars by the toilet and in the tub and shower. Do not use towel bars as grab bars. Use non-skid mats or decals in the tub or shower. If you need to sit down in the shower, use a plastic, non-slip stool. Keep the floor dry. Clean up any water that spills on the floor as soon as it  happens. Remove soap buildup in the tub or shower regularly. Attach bath mats securely with double-sided non-slip rug tape. Do not have throw rugs and other things on the floor that can make you trip. What can I do in the bedroom? Use night lights. Make sure that you have a light by your bed that is easy to reach. Do not use any sheets or blankets that are too big for your bed. They should not hang down onto the floor. Have a firm chair that has side arms. You can use this for support while you get dressed. Do not have throw rugs and other things on the floor that can make you trip. What can I do in the kitchen? Clean up any spills right away. Avoid walking on wet floors. Keep items that you use a lot in easy-to-reach places. If you need to reach something above you, use a strong step stool that has a grab bar. Keep electrical cords out of the way. Do not use floor polish or wax that makes floors slippery. If you must use wax, use non-skid floor wax. Do not have throw rugs and other things on the floor that can make you trip. What can I do with my stairs? Do not leave any items on the stairs. Make sure that there are handrails on both sides of the stairs and use them. Fix handrails that are broken or loose. Make sure that handrails  are as long as the stairways. Check any carpeting to make sure that it is firmly attached to the stairs. Fix any carpet that is loose or worn. Avoid having throw rugs at the top or bottom of the stairs. If you do have throw rugs, attach them to the floor with carpet tape. Make sure that you have a light switch at the top of the stairs and the bottom of the stairs. If you do not have them, ask someone to add them for you. What else can I do to help prevent falls? Wear shoes that: Do not have high heels. Have rubber bottoms. Are comfortable and fit you well. Are closed at the toe. Do not wear sandals. If you use a stepladder: Make sure that it is fully opened.  Do not climb a closed stepladder. Make sure that both sides of the stepladder are locked into place. Ask someone to hold it for you, if possible. Clearly mark and make sure that you can see: Any grab bars or handrails. First and last steps. Where the edge of each step is. Use tools that help you move around (mobility aids) if they are needed. These include: Canes. Walkers. Scooters. Crutches. Turn on the lights when you go into a dark area. Replace any light bulbs as soon as they burn out. Set up your furniture so you have a clear path. Avoid moving your furniture around. If any of your floors are uneven, fix them. If there are any pets around you, be aware of where they are. Review your medicines with your doctor. Some medicines can make you feel dizzy. This can increase your chance of falling. Ask your doctor what other things that you can do to help prevent falls. This information is not intended to replace advice given to you by your health care provider. Make sure you discuss any questions you have with your health care provider. Document Released: 06/11/2009 Document Revised: 01/21/2016 Document Reviewed: 09/19/2014 Elsevier Interactive Patient Education  2017 [employer].

## 2021-04-02 ENCOUNTER — Other Ambulatory Visit: Payer: Self-pay

## 2021-04-02 ENCOUNTER — Encounter: Payer: Self-pay | Admitting: Internal Medicine

## 2021-04-02 ENCOUNTER — Ambulatory Visit (INDEPENDENT_AMBULATORY_CARE_PROVIDER_SITE_OTHER): Payer: Medicare HMO | Admitting: Internal Medicine

## 2021-04-02 VITALS — BP 148/90 | HR 74 | Ht 64.0 in | Wt 199.2 lb

## 2021-04-02 DIAGNOSIS — E785 Hyperlipidemia, unspecified: Secondary | ICD-10-CM

## 2021-04-02 DIAGNOSIS — I700 Atherosclerosis of aorta: Secondary | ICD-10-CM | POA: Diagnosis not present

## 2021-04-02 DIAGNOSIS — I10 Essential (primary) hypertension: Secondary | ICD-10-CM | POA: Diagnosis not present

## 2021-04-02 DIAGNOSIS — G8929 Other chronic pain: Secondary | ICD-10-CM

## 2021-04-02 DIAGNOSIS — M5442 Lumbago with sciatica, left side: Secondary | ICD-10-CM | POA: Diagnosis not present

## 2021-04-02 DIAGNOSIS — N1831 Chronic kidney disease, stage 3a: Secondary | ICD-10-CM

## 2021-04-02 NOTE — Progress Notes (Signed)
Date:  04/02/2021   Name:  Edward Rogers   DOB:  [birth year]   MRN:  [id number]   Chief Complaint: No chief complaint on file.  Hypertension This is a chronic problem. The problem is resistant (but slightly improved.). Associated symptoms include shortness of breath. Pertinent negatives include no chest pain or headaches. Past treatments include angiotensin blockers, direct vasodilators and calcium channel blockers (stopped hydralazine and started clonidine). There are no compliance problems.   Back Pain This is a recurrent problem. The problem occurs intermittently. The pain is present in the lumbar spine. The quality of the pain is described as aching. The pain radiates to the right foot. The pain is mild. Pertinent negatives include no chest pain, fever or headaches. Treatments tried: gabapentin helped but may have caused nightmares.  Hyperlipidemia This is a chronic problem. Associated symptoms include shortness of breath. Pertinent negatives include no chest pain. Current antihyperlipidemic treatment includes statins (lipids high last visit so crestor added).  CKD - continuing to monitor.  Avoid colchicine if possible along with other nsaids.  Lab Results  Component Value Date   CREATININE 1.64 (H) 07/07/2020   BUN 21 07/07/2020   NA 142 07/07/2020   K 4.3 07/07/2020   CL 102 07/07/2020   CO2 26 07/07/2020   Lab Results  Component Value Date   CHOL 215 (H) 07/07/2020   HDL 25 (L) 07/07/2020   LDLCALC 134 (H) 07/07/2020   TRIG 309 (H) 07/07/2020   CHOLHDL 8.6 (H) 07/07/2020   Lab Results  Component Value Date   TSH 1.10 04/24/2014   No results found for: HGBA1C Lab Results  Component Value Date   WBC 4.5 10/09/2019   HGB 15.8 10/09/2019   HCT 47.8 10/09/2019   MCV 83 10/09/2019   PLT 242 10/09/2019   Lab Results  Component Value Date   ALT 25 07/07/2020   AST 24 07/07/2020   ALKPHOS 91 07/07/2020   BILITOT 0.4 07/07/2020     Review of Systems   Constitutional:  Negative for chills, fatigue and fever.  Respiratory:  Positive for chest tightness, shortness of breath and wheezing. Negative for cough and choking.   Cardiovascular:  Negative for chest pain and leg swelling.  Musculoskeletal:  Positive for back pain.  Neurological:  Negative for dizziness, light-headedness and headaches.  Psychiatric/Behavioral:  Negative for dysphoric mood and sleep disturbance. The patient is not nervous/anxious.    Patient Active Problem List   Diagnosis Date Noted   Lumbar radiculopathy 07/17/2020   Aortic atherosclerosis (HCC) 07/06/2020   Chronic kidney disease, stage 3a (HCC) 07/06/2020   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   Arthritis of joint of toe 06/01/2017   Chronic left-sided low back pain with left-sided sciatica 11/30/2016   Gout 04/11/2015   Dyslipidemia 04/11/2015   Essential (primary) hypertension 04/11/2015   Sarcoidosis 04/11/2015   Other allergic rhinitis 04/11/2015   Tobacco use disorder, moderate, in sustained remission 04/11/2015   Elevated PSA 04/11/2015    No Known Allergies  Past Surgical History:  Procedure Laterality Date   COLONOSCOPY  2009   normal   PROSTATE BIOPSY  2014   normal    Social History   Tobacco Use   Smoking status: Former    Packs/day: 0.50    Years: 30.00    Pack years: 15.00    Types: Cigarettes    Quit date: 08/30/2012    Years since quitting: 8.5   Smokeless tobacco: Never  Vaping  Use   Vaping Use: Never used  Substance Use Topics   Alcohol use: Yes    Alcohol/week: 0.0 standard drinks    Comment: rarely   Drug use: No     Medication list has been reviewed and updated.  Current Meds  Medication Sig   albuterol (VENTOLIN HFA) 108 (90 Base) MCG/ACT inhaler Inhale 2 puffs into the lungs every 6 (six) hours as needed for wheezing or shortness of breath.   allopurinol (ZYLOPRIM) 100 MG tablet Take 2 tablets by mouth once daily   amLODipine (NORVASC) 10 MG  tablet Take 1 tablet (10 mg total) by mouth daily.   cloNIDine (CATAPRES) 0.1 MG tablet Take 1 tablet (0.1 mg total) by mouth 2 (two) times daily.   colchicine 0.6 MG tablet Take 1 tablet (0.6 mg total) by mouth 2 (two) times daily as needed.   fluticasone (FLONASE) 50 MCG/ACT nasal spray Place 2 sprays into both nostrils daily.   gabapentin (NEURONTIN) 300 MG capsule Take 1 capsule (300 mg total) by mouth at bedtime.   irbesartan (AVAPRO) 300 MG tablet Take 1 tablet (300 mg total) by mouth daily.   montelukast (SINGULAIR) 10 MG tablet Take 1 tablet (10 mg total) by mouth at bedtime.   naproxen sodium (ALEVE) 220 MG tablet Take 220 mg by mouth daily as needed.   rosuvastatin (CRESTOR) 40 MG tablet Take 1 tablet (40 mg total) by mouth daily.    PHQ 2/9 Scores 04/02/2021 03/08/2021 02/12/2021 08/17/2020  PHQ - 2 Score 0 0 0 0  PHQ- 9 Score 0 - 0 0    GAD 7 : Generalized Anxiety Score 04/02/2021 02/12/2021 08/17/2020 07/07/2020  Nervous, Anxious, on Edge 0 0 0 0  Control/stop worrying 0 0 0 0  Worry too much - different things 0 0 0 0  Trouble relaxing 0 0 0 0  Restless 0 0 0 0  Easily annoyed or irritable 0 0 0 0  Afraid - awful might happen 0 0 0 0  Total GAD 7 Score 0 0 0 0  Anxiety Difficulty Not difficult at all - - -    BP Readings from Last 3 Encounters:  04/02/21 (!) 148/90  02/12/21 (!) 160/98  08/17/20 (!) 140/100    Physical Exam Vitals and nursing note reviewed.  Constitutional:      General: He is not in acute distress.    Appearance: Normal appearance. He is well-developed.  HENT:     Head: Normocephalic and atraumatic.  Cardiovascular:     Rate and Rhythm: Normal rate and regular rhythm.     Pulses: Normal pulses.  Pulmonary:     Effort: Pulmonary effort is normal. No respiratory distress.     Breath sounds: No wheezing or rhonchi.  Musculoskeletal:     Cervical back: Normal range of motion.     Right lower leg: No edema.     Left lower leg: No edema.  Skin:     General: Skin is warm and dry.     Findings: No rash.  Neurological:     Mental Status: He is alert and oriented to person, place, and time.     Motor: Motor function is intact. No weakness or atrophy.     Gait: Gait is intact.     Deep Tendon Reflexes:     Reflex Scores:      Patellar reflexes are 1+ on the right side and 1+ on the left side. Psychiatric:  Mood and Affect: Mood normal.        Behavior: Behavior normal.    Wt Readings from Last 3 Encounters:  04/02/21 199 lb 3.2 oz (90.4 kg)  02/12/21 194 lb (88 kg)  08/17/20 196 lb (88.9 kg)    BP (!) 148/90 (BP Location: Right Arm, Patient Position: Sitting, Cuff Size: Large)   Pulse 74   Ht 5\' 4"  (1.626 m)   Wt 199 lb 3.2 oz (90.4 kg)   SpO2 96%   BMI 34.19 kg/m   Assessment and Plan: 1. Essential (primary) hypertension BP is not controlled but improved from previously. Continue current medications. He believes that he can double down on his diet and loss some weight before the next visit which should help his BP Also need to consider the advanced HTN clinic.  2. Dyslipidemia Now on crestor for persistently elevated LDL Check labs and LFTs - Lipid panel  3. Chronic kidney disease, stage 3a (HCC) Mild chronic CKD; work on BP control and avoidance of exacerbating agents - Comprehensive metabolic panel  4. Aortic atherosclerosis (HCC) Seen on CT  5. Chronic left-sided low back pain with left-sided sciatica Discontinue gabapentin due to possible night mares If these resolve, consider trial of Lyrica   Partially dictated using . Any errors are unintentional.  Animal nutritionist, MD [facility]  04/02/2021

## 2021-04-02 NOTE — Patient Instructions (Signed)
Stop gabapentin - if night mares resolve, call and we can try Lyrica.

## 2021-04-03 LAB — COMPREHENSIVE METABOLIC PANEL
ALT: 14 IU/L (ref 0–44)
AST: 16 IU/L (ref 0–40)
Albumin/Globulin Ratio: 1.2 (ref 1.2–2.2)
Albumin: 3.9 g/dL (ref 3.7–4.7)
Alkaline Phosphatase: 104 IU/L (ref 44–121)
BUN/Creatinine Ratio: 13 (ref 10–24)
BUN: 20 mg/dL (ref 8–27)
Bilirubin Total: 0.3 mg/dL (ref 0.0–1.2)
CO2: 20 mmol/L (ref 20–29)
Calcium: 9.4 mg/dL (ref 8.6–10.2)
Chloride: 105 mmol/L (ref 96–106)
Creatinine, Ser: 1.56 mg/dL — ABNORMAL HIGH (ref 0.76–1.27)
Globulin, Total: 3.2 g/dL (ref 1.5–4.5)
Glucose: 91 mg/dL (ref 65–99)
Potassium: 4.2 mmol/L (ref 3.5–5.2)
Sodium: 142 mmol/L (ref 134–144)
Total Protein: 7.1 g/dL (ref 6.0–8.5)
eGFR: 45 mL/min/{1.73_m2} — ABNORMAL LOW (ref >59)

## 2021-04-03 LAB — LIPID PANEL
Chol/HDL Ratio: 7.5 ratio — ABNORMAL HIGH (ref 0.0–5.0)
Cholesterol, Total: 210 mg/dL — ABNORMAL HIGH (ref 100–199)
HDL: 28 mg/dL — ABNORMAL LOW (ref >39)
LDL Chol Calc (NIH): 110 mg/dL — ABNORMAL HIGH (ref 0–99)
Triglycerides: 419 mg/dL — ABNORMAL HIGH (ref 0–149)
VLDL Cholesterol Cal: 72 mg/dL — ABNORMAL HIGH (ref 5–40)

## 2021-06-30 ENCOUNTER — Ambulatory Visit (INDEPENDENT_AMBULATORY_CARE_PROVIDER_SITE_OTHER): Payer: Medicare HMO | Admitting: Internal Medicine

## 2021-06-30 ENCOUNTER — Other Ambulatory Visit: Payer: Self-pay

## 2021-06-30 ENCOUNTER — Encounter: Payer: Self-pay | Admitting: Internal Medicine

## 2021-06-30 VITALS — BP 138/74 | HR 87 | Ht 64.0 in | Wt 198.0 lb

## 2021-06-30 DIAGNOSIS — N1831 Chronic kidney disease, stage 3a: Secondary | ICD-10-CM

## 2021-06-30 DIAGNOSIS — I10 Essential (primary) hypertension: Secondary | ICD-10-CM

## 2021-06-30 DIAGNOSIS — E785 Hyperlipidemia, unspecified: Secondary | ICD-10-CM | POA: Diagnosis not present

## 2021-06-30 DIAGNOSIS — R7303 Prediabetes: Secondary | ICD-10-CM | POA: Diagnosis not present

## 2021-06-30 DIAGNOSIS — R972 Elevated prostate specific antigen [PSA]: Secondary | ICD-10-CM

## 2021-06-30 DIAGNOSIS — Z23 Encounter for immunization: Secondary | ICD-10-CM

## 2021-06-30 DIAGNOSIS — Z0000 Encounter for general adult medical examination without abnormal findings: Secondary | ICD-10-CM | POA: Diagnosis not present

## 2021-06-30 MED ORDER — ROSUVASTATIN CALCIUM 40 MG PO TABS: 40.0000 mg | ORAL_TABLET | Freq: Every day | ORAL | 1 refills | Status: DC

## 2021-06-30 MED ORDER — AMLODIPINE BESYLATE 10 MG PO TABS: 10.0000 mg | ORAL_TABLET | Freq: Every day | ORAL | 1 refills | Status: DC

## 2021-06-30 MED ORDER — CLONIDINE HCL 0.1 MG PO TABS: 0.1000 mg | ORAL_TABLET | Freq: Two times a day (BID) | ORAL | 1 refills | Status: DC

## 2021-06-30 MED ORDER — IRBESARTAN 300 MG PO TABS: 300.0000 mg | ORAL_TABLET | Freq: Every day | ORAL | 1 refills | Status: DC

## 2021-06-30 NOTE — Progress Notes (Signed)
Date:  06/30/2021   Name:  Edward Rogers   DOB:  Aug 13, 1946   MRN:  [id number]   Chief Complaint: Annual Exam Edward Rogers is a 75 y.o. male who presents today for his Complete Annual Exam. He feels well. He reports exercising - very little. He reports he is sleeping well.   Colonoscopy: FIT 06/2019  Immunization History  Administered Date(s) Administered   Moderna Sars-Covid-2 Vaccination 11/23/2019, 12/26/2019, 09/08/2020   Pneumococcal Conjugate-13 11/04/2015   Pneumococcal Polysaccharide-23 08/31/2007, 06/13/2018   Zoster, Live 02/11/2008    Hypertension This is a chronic problem. The problem is resistant. Pertinent negatives include no chest pain, headaches, palpitations or shortness of breath. Past treatments include angiotensin blockers, central alpha agonists and calcium channel blockers.  Hyperlipidemia This is a chronic problem. The problem is uncontrolled. Pertinent negatives include no chest pain, myalgias or shortness of breath. Current antihyperlipidemic treatment includes statins. The current treatment provides significant improvement of lipids.  CKD - GFR ~ 45 Lab Results  Component Value Date   CREATININE 1.56 (H) 04/02/2021   BUN 20 04/02/2021   NA 142 04/02/2021   K 4.2 04/02/2021   CL 105 04/02/2021   CO2 20 04/02/2021   Lab Results  Component Value Date   CHOL 210 (H) 04/02/2021   HDL 28 (L) 04/02/2021   LDLCALC 110 (H) 04/02/2021   TRIG 419 (H) 04/02/2021   CHOLHDL 7.5 (H) 04/02/2021   Lab Results  Component Value Date   TSH 1.10 04/24/2014   No results found for: HGBA1C Lab Results  Component Value Date   WBC 4.5 10/09/2019   HGB 15.8 10/09/2019   HCT 47.8 10/09/2019   MCV 83 10/09/2019   PLT 242 10/09/2019   Lab Results  Component Value Date   ALT 14 04/02/2021   AST 16 04/02/2021   ALKPHOS 104 04/02/2021   BILITOT 0.3 04/02/2021   Lab Results  Component Value Date   PSA1 0.5 06/13/2018   PSA1 0.5 11/30/2016    PSA1 0.4 11/04/2015   PSA 0.6 04/24/2014    Review of Systems  Constitutional:  Negative for appetite change, chills, diaphoresis, fatigue and unexpected weight change.  HENT:  Negative for hearing loss, tinnitus, trouble swallowing and voice change.   Eyes:  Negative for visual disturbance.  Respiratory:  Negative for choking, shortness of breath and wheezing.   Cardiovascular:  Negative for chest pain, palpitations and leg swelling.  Gastrointestinal:  Negative for abdominal pain, blood in stool, constipation and diarrhea.  Genitourinary:  Positive for frequency (nocturia 1-2). Negative for difficulty urinating and dysuria.  Musculoskeletal:  Negative for arthralgias, back pain and myalgias.  Skin:  Negative for color change and rash.  Neurological:  Negative for dizziness, syncope and headaches.  Hematological:  Negative for adenopathy.  Psychiatric/Behavioral:  Negative for dysphoric mood and sleep disturbance. The patient is not nervous/anxious.    Patient Active Problem List   Diagnosis Date Noted   Lumbar radiculopathy 07/17/2020   Aortic atherosclerosis (HCC) 07/06/2020   Chronic kidney disease, stage 3a (HCC) 07/06/2020   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   Arthritis of joint of toe 06/01/2017   Chronic left-sided low back pain with left-sided sciatica 11/30/2016   Gout 04/11/2015   Dyslipidemia 04/11/2015   Essential (primary) hypertension 04/11/2015   Sarcoidosis 04/11/2015   Other allergic rhinitis 04/11/2015   Tobacco use disorder, moderate, in sustained remission 04/11/2015   Elevated PSA 04/11/2015    No Known Allergies  Past Surgical History:  Procedure Laterality Date   COLONOSCOPY  2009   normal   PROSTATE BIOPSY  2014   normal    Social History   Tobacco Use   Smoking status: Former    Packs/day: 0.50    Years: 30.00    Pack years: 15.00    Types: Cigarettes    Quit date: 08/30/2012    Years since quitting: 8.8   Smokeless  tobacco: Never  Vaping Use   Vaping Use: Never used  Substance Use Topics   Alcohol use: Yes    Alcohol/week: 0.0 standard drinks    Comment: rarely   Drug use: No     Medication list has been reviewed and updated.  Current Meds  Medication Sig   albuterol (VENTOLIN HFA) 108 (90 Base) MCG/ACT inhaler Inhale 2 puffs into the lungs every 6 (six) hours as needed for wheezing or shortness of breath.   allopurinol (ZYLOPRIM) 100 MG tablet Take 2 tablets by mouth once daily   amLODipine (NORVASC) 10 MG tablet Take 1 tablet (10 mg total) by mouth daily.   cloNIDine (CATAPRES) 0.1 MG tablet Take 1 tablet (0.1 mg total) by mouth 2 (two) times daily.   colchicine 0.6 MG tablet Take 1 tablet (0.6 mg total) by mouth 2 (two) times daily as needed.   fluticasone (FLONASE) 50 MCG/ACT nasal spray Place 2 sprays into both nostrils daily.   gabapentin (NEURONTIN) 300 MG capsule Take 1 capsule (300 mg total) by mouth at bedtime.   irbesartan (AVAPRO) 300 MG tablet Take 1 tablet (300 mg total) by mouth daily.   montelukast (SINGULAIR) 10 MG tablet Take 1 tablet (10 mg total) by mouth at bedtime.   naproxen sodium (ALEVE) 220 MG tablet Take 220 mg by mouth daily as needed.   rosuvastatin (CRESTOR) 40 MG tablet Take 1 tablet (40 mg total) by mouth daily.    PHQ 2/9 Scores 06/30/2021 06/30/2021 04/02/2021 03/08/2021  PHQ - 2 Score 0 0 0 0  PHQ- 9 Score 0 0 0 -    GAD 7 : Generalized Anxiety Score 06/30/2021 06/30/2021 04/02/2021 02/12/2021  Nervous, Anxious, on Edge 0 0 0 0  Control/stop worrying 0 0 0 0  Worry too much - different things 0 0 0 0  Trouble relaxing 0 0 0 0  Restless 0 0 0 0  Easily annoyed or irritable 0 0 0 0  Afraid - awful might happen 0 0 0 0  Total GAD 7 Score 0 0 0 0  Anxiety Difficulty Not difficult at all Not difficult at all Not difficult at all -    BP Readings from Last 3 Encounters:  06/30/21 138/74  04/02/21 (!) 148/90  02/12/21 (!) 160/98    Physical Exam Vitals and  nursing note reviewed.  Constitutional:      Appearance: Normal appearance. He is well-developed.  HENT:     Head: Normocephalic.     Right Ear: Tympanic membrane, ear canal and external ear normal.     Left Ear: Tympanic membrane, ear canal and external ear normal.     Nose: Nose normal.  Eyes:     Conjunctiva/sclera: Conjunctivae normal.     Pupils: Pupils are equal, round, and reactive to light.  Neck:     Thyroid: No thyromegaly.     Vascular: No carotid bruit.  Cardiovascular:     Rate and Rhythm: Normal rate and regular rhythm.     Heart sounds: Normal heart sounds.  Pulmonary:  Effort: Pulmonary effort is normal.     Breath sounds: Normal breath sounds. No wheezing.  Chest:  Breasts:    Right: No mass.     Left: No mass.  Abdominal:     General: Bowel sounds are normal.     Palpations: Abdomen is soft.     Tenderness: There is no abdominal tenderness.  Musculoskeletal:        General: Normal range of motion.     Cervical back: Normal range of motion and neck supple.  Lymphadenopathy:     Cervical: No cervical adenopathy.  Skin:    General: Skin is warm and dry.  Neurological:     Mental Status: He is alert and oriented to person, place, and time.     Deep Tendon Reflexes: Reflexes are normal and symmetric.  Psychiatric:        Attention and Perception: Attention normal.        Mood and Affect: Mood normal.        Thought Content: Thought content normal.    Wt Readings from Last 3 Encounters:  06/30/21 198 lb (89.8 kg)  04/02/21 199 lb 3.2 oz (90.4 kg)  02/12/21 194 lb (88 kg)    BP 138/74   Pulse 87   Ht 5\' 4"  (1.626 m)   Wt 198 lb (89.8 kg)   SpO2 96%   BMI 33.99 kg/m   Assessment and Plan: 1. Annual physical exam Up to date on screenings and immunizations Flu vaccine today Continue healthy diet, begin more regular exercise - Hemoglobin A1c  2. Essential (primary) hypertension Clinically stable exam with well controlled BP. He thinks that  he is taking one medication 2 tabs twice a day - he will check at home and message. Tolerating medications without side effects at this time. Pt to continue current regimen and low sodium diet; benefits of regular exercise as able discussed. - CBC with Differential/Platelet - Comprehensive metabolic panel - TSH - POCT urinalysis dipstick - amLODipine (NORVASC) 10 MG tablet; Take 1 tablet (10 mg total) by mouth daily.  Dispense: 90 tablet; Refill: 1 - irbesartan (AVAPRO) 300 MG tablet; Take 1 tablet (300 mg total) by mouth daily.  Dispense: 90 tablet; Refill: 1 - cloNIDine (CATAPRES) 0.1 MG tablet; Take 1 tablet (0.1 mg total) by mouth 2 (two) times daily.  Dispense: 180 tablet; Refill: 1  3. Dyslipidemia Continue daily Crestor - Lipid panel - rosuvastatin (CRESTOR) 40 MG tablet; Take 1 tablet (40 mg total) by mouth daily.  Dispense: 90 tablet; Refill: 1  4. Elevated PSA S/p biopsy a number of years ago.  No recent PSA. Moderate nocturia not interrupting sleep - PSA  5. Chronic kidney disease, stage 3a ([place]) Long discussion regarding HTN control and use of NSAIDs in CKD. He takes naproxen daily due to multiple joint complaints. He would be willing to consider Wilder Glade as an additional medication for CV and Renal protection.   Partially dictated using Editor, commissioning. Any errors are unintentional.  Halina Maidens, MD [facility]  06/30/2021

## 2021-07-01 ENCOUNTER — Encounter: Payer: Self-pay | Admitting: Internal Medicine

## 2021-07-01 DIAGNOSIS — R7303 Prediabetes: Secondary | ICD-10-CM | POA: Insufficient documentation

## 2021-07-01 LAB — COMPREHENSIVE METABOLIC PANEL
ALT: 15 IU/L (ref 0–44)
AST: 20 IU/L (ref 0–40)
Albumin/Globulin Ratio: 1.4 (ref 1.2–2.2)
Albumin: 4.2 g/dL (ref 3.7–4.7)
Alkaline Phosphatase: 96 IU/L (ref 44–121)
BUN/Creatinine Ratio: 11 (ref 10–24)
BUN: 17 mg/dL (ref 8–27)
Bilirubin Total: 0.4 mg/dL (ref 0.0–1.2)
CO2: 23 mmol/L (ref 20–29)
Calcium: 9.6 mg/dL (ref 8.6–10.2)
Chloride: 103 mmol/L (ref 96–106)
Creatinine, Ser: 1.49 mg/dL — ABNORMAL HIGH (ref 0.76–1.27)
Globulin, Total: 3.1 g/dL (ref 1.5–4.5)
Glucose: 103 mg/dL — ABNORMAL HIGH (ref 70–99)
Potassium: 4.4 mmol/L (ref 3.5–5.2)
Sodium: 142 mmol/L (ref 134–144)
Total Protein: 7.3 g/dL (ref 6.0–8.5)
eGFR: 48 mL/min/{1.73_m2} — ABNORMAL LOW (ref >59)

## 2021-07-01 LAB — CBC WITH DIFFERENTIAL/PLATELET
Basophils Absolute: 0.1 10*3/uL (ref 0.0–0.2)
Basos: 2 %
EOS (ABSOLUTE): 0.6 10*3/uL — ABNORMAL HIGH (ref 0.0–0.4)
Eos: 13 %
Hematocrit: 45 % (ref 37.5–51.0)
Hemoglobin: 14.3 g/dL (ref 13.0–17.7)
Immature Grans (Abs): 0 10*3/uL (ref 0.0–0.1)
Immature Granulocytes: 0 %
Lymphocytes Absolute: 1.4 10*3/uL (ref 0.7–3.1)
Lymphs: 29 %
MCH: 26.7 pg (ref 26.6–33.0)
MCHC: 31.8 g/dL (ref 31.5–35.7)
MCV: 84 fL (ref 79–97)
Monocytes Absolute: 0.8 10*3/uL (ref 0.1–0.9)
Monocytes: 16 %
Neutrophils Absolute: 1.9 10*3/uL (ref 1.4–7.0)
Neutrophils: 40 %
Platelets: 259 10*3/uL (ref 150–450)
RBC: 5.35 x10E6/uL (ref 4.14–5.80)
RDW: 14.1 % (ref 11.6–15.4)
WBC: 4.8 10*3/uL (ref 3.4–10.8)

## 2021-07-01 LAB — LIPID PANEL
Chol/HDL Ratio: 7.4 ratio — ABNORMAL HIGH (ref 0.0–5.0)
Cholesterol, Total: 192 mg/dL (ref 100–199)
HDL: 26 mg/dL — ABNORMAL LOW (ref >39)
LDL Chol Calc (NIH): 106 mg/dL — ABNORMAL HIGH (ref 0–99)
Triglycerides: 349 mg/dL — ABNORMAL HIGH (ref 0–149)
VLDL Cholesterol Cal: 60 mg/dL — ABNORMAL HIGH (ref 5–40)

## 2021-07-01 LAB — HEMOGLOBIN A1C
Est. average glucose Bld gHb Est-mCnc: 134 mg/dL
Hgb A1c MFr Bld: 6.3 % — ABNORMAL HIGH (ref 4.8–5.6)

## 2021-07-01 LAB — TSH: TSH: 1.2 u[IU]/mL (ref 0.450–4.500)

## 2021-07-01 LAB — PSA: Prostate Specific Ag, Serum: 1.3 ng/mL (ref 0.0–4.0)

## 2021-08-19 ENCOUNTER — Ambulatory Visit (INDEPENDENT_AMBULATORY_CARE_PROVIDER_SITE_OTHER): Payer: Medicare HMO | Admitting: Internal Medicine

## 2021-08-19 ENCOUNTER — Other Ambulatory Visit: Payer: Self-pay

## 2021-08-19 ENCOUNTER — Encounter: Payer: Self-pay | Admitting: Internal Medicine

## 2021-08-19 VITALS — BP 138/88 | HR 115 | Temp 97.7°F | Ht 65.0 in | Wt 197.8 lb

## 2021-08-19 DIAGNOSIS — M1000 Idiopathic gout, unspecified site: Secondary | ICD-10-CM | POA: Diagnosis not present

## 2021-08-19 DIAGNOSIS — J0100 Acute maxillary sinusitis, unspecified: Secondary | ICD-10-CM | POA: Diagnosis not present

## 2021-08-19 DIAGNOSIS — I10 Essential (primary) hypertension: Secondary | ICD-10-CM | POA: Diagnosis not present

## 2021-08-19 MED ORDER — CEFDINIR 300 MG PO CAPS: 300.0000 mg | ORAL_CAPSULE | Freq: Two times a day (BID) | ORAL | 0 refills | Status: AC

## 2021-08-19 MED ORDER — ALLOPURINOL 100 MG PO TABS: 200.0000 mg | ORAL_TABLET | Freq: Every day | ORAL | 3 refills | Status: DC

## 2021-08-19 NOTE — Patient Instructions (Signed)
Coricidin HBP - use as directed  Lots of fluids  Mucinex-DM or Plain but not "D"

## 2021-08-19 NOTE — Progress Notes (Signed)
Date:  08/19/2021   Name:  Edward Rogers   DOB:  [birth year]   MRN:  [id number]   Chief Complaint: Sinusitis  Sinusitis This is a new problem. The current episode started in the past 7 days. There has been no fever. Associated symptoms include chills, congestion and sneezing. Pertinent negatives include no coughing, shortness of breath or sore throat.   Lab Results  Component Value Date   NA 142 06/30/2021   K 4.4 06/30/2021   CO2 23 06/30/2021   GLUCOSE 103 (H) 06/30/2021   BUN 17 06/30/2021   CREATININE 1.49 (H) 06/30/2021   CALCIUM 9.6 06/30/2021   EGFR 48 (L) 06/30/2021   GFRNONAA 40 (L) 07/07/2020   Lab Results  Component Value Date   CHOL 192 06/30/2021   HDL 26 (L) 06/30/2021   LDLCALC 106 (H) 06/30/2021   TRIG 349 (H) 06/30/2021   CHOLHDL 7.4 (H) 06/30/2021   Lab Results  Component Value Date   TSH 1.200 06/30/2021   Lab Results  Component Value Date   HGBA1C 6.3 (H) 06/30/2021   Lab Results  Component Value Date   WBC 4.8 06/30/2021   HGB 14.3 06/30/2021   HCT 45.0 06/30/2021   MCV 84 06/30/2021   PLT 259 06/30/2021   Lab Results  Component Value Date   ALT 15 06/30/2021   AST 20 06/30/2021   ALKPHOS 96 06/30/2021   BILITOT 0.4 06/30/2021   No results found for: 25OHVITD2, 25OHVITD3, VD25OH   Review of Systems  Constitutional:  Positive for chills.  HENT:  Positive for congestion and sneezing. Negative for sore throat.   Respiratory:  Negative for cough and shortness of breath.    Patient Active Problem List   Diagnosis Date Noted   Prediabetes 07/01/2021   Lumbar radiculopathy 07/17/2020   Aortic atherosclerosis ([place]) 07/06/2020   Chronic kidney disease, stage 3a ([place]) 07/06/2020   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   Arthritis of joint of toe 06/01/2017   Chronic left-sided low back pain with left-sided sciatica 11/30/2016   Gout 04/11/2015   Dyslipidemia 04/11/2015   Essential (primary) hypertension  04/11/2015   Sarcoidosis 04/11/2015   Other allergic rhinitis 04/11/2015   Tobacco use disorder, moderate, in sustained remission 04/11/2015   Elevated PSA 04/11/2015    No Known Allergies  Past Surgical History:  Procedure Laterality Date   COLONOSCOPY  2009   normal   PROSTATE BIOPSY  2014   normal    Social History   Tobacco Use   Smoking status: Former    Packs/day: 0.50    Years: 30.00    Pack years: 15.00    Types: Cigarettes    Quit date: 08/30/2012    Years since quitting: 8.9   Smokeless tobacco: Never  Vaping Use   Vaping Use: Never used  Substance Use Topics   Alcohol use: Yes    Alcohol/week: 0.0 standard drinks    Comment: rarely   Drug use: No     Medication list has been reviewed and updated.  No outpatient medications have been marked as taking for the 08/19/21 encounter (Office Visit) with Glean Hess, MD.    [facility] 2/9 Scores 06/30/2021 06/30/2021 04/02/2021 03/08/2021  PHQ - 2 Score 0 0 0 0  PHQ- 9 Score 0 0 0 -    GAD 7 : Generalized Anxiety Score 06/30/2021 06/30/2021 04/02/2021 02/12/2021  Nervous, Anxious, on Edge 0 0 0 0  Control/stop worrying 0 0 0 0  Worry too much - different things 0 0 0 0  Trouble relaxing 0 0 0 0  Restless 0 0 0 0  Easily annoyed or irritable 0 0 0 0  Afraid - awful might happen 0 0 0 0  Total GAD 7 Score 0 0 0 0  Anxiety Difficulty Not difficult at all Not difficult at all Not difficult at all -    BP Readings from Last 3 Encounters:  06/30/21 138/74  04/02/21 (!) 148/90  02/12/21 (!) 160/98    Physical Exam  Wt Readings from Last 3 Encounters:  06/30/21 198 lb (89.8 kg)  04/02/21 199 lb 3.2 oz (90.4 kg)  02/12/21 194 lb (88 kg)    Ht _0  (1.651 m)    BMI 32.95 kg/m   Assessment and Plan:

## 2021-08-19 NOTE — Progress Notes (Signed)
Date:  08/19/2021   Name:  Edward Rogers   DOB:  1945-10-26   MRN:  [id number]   Chief Complaint: Sinusitis  Sinusitis This is a new problem. Episode onset: x2 weeks. The problem has been gradually worsening since onset. There has been no fever. The pain is mild. Associated symptoms include chills, congestion, coughing, diaphoresis, ear pain, sinus pressure and a sore throat. Past treatments include spray decongestants. The treatment provided mild relief.  Hypertension This is a chronic problem. The problem is controlled. Past treatments include angiotensin blockers, direct vasodilators and calcium channel blockers. The current treatment provides significant improvement. There are no compliance problems.    Lab Results  Component Value Date   NA 142 06/30/2021   K 4.4 06/30/2021   CO2 23 06/30/2021   GLUCOSE 103 (H) 06/30/2021   BUN 17 06/30/2021   CREATININE 1.49 (H) 06/30/2021   CALCIUM 9.6 06/30/2021   EGFR 48 (L) 06/30/2021   GFRNONAA 40 (L) 07/07/2020   Lab Results  Component Value Date   CHOL 192 06/30/2021   HDL 26 (L) 06/30/2021   LDLCALC 106 (H) 06/30/2021   TRIG 349 (H) 06/30/2021   CHOLHDL 7.4 (H) 06/30/2021   Lab Results  Component Value Date   TSH 1.200 06/30/2021   Lab Results  Component Value Date   HGBA1C 6.3 (H) 06/30/2021   Lab Results  Component Value Date   WBC 4.8 06/30/2021   HGB 14.3 06/30/2021   HCT 45.0 06/30/2021   MCV 84 06/30/2021   PLT 259 06/30/2021   Lab Results  Component Value Date   ALT 15 06/30/2021   AST 20 06/30/2021   ALKPHOS 96 06/30/2021   BILITOT 0.4 06/30/2021   No results found for: 25OHVITD2, 25OHVITD3, VD25OH   Review of Systems  Constitutional:  Positive for chills and diaphoresis.  HENT:  Positive for congestion, ear pain, sinus pressure and sore throat.   Respiratory:  Positive for cough.    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   Arthritis of joint of toe 06/01/2017   Chronic left-sided low back pain with left-sided sciatica 11/30/2016   Gout 04/11/2015   Dyslipidemia 04/11/2015   Essential (primary) hypertension 04/11/2015   Sarcoidosis 04/11/2015   Other allergic rhinitis 04/11/2015   Tobacco use disorder, moderate, in sustained remission 04/11/2015   Elevated PSA 04/11/2015    No Known Allergies  Past Surgical History:  Procedure Laterality Date   COLONOSCOPY  2009   normal   PROSTATE BIOPSY  2014   normal    Social History   Tobacco Use   Smoking status: Former    Packs/day: 0.50    Years: 30.00    Pack years: 15.00    Types: Cigarettes    Quit date: 08/30/2012    Years since quitting: 8.9   Smokeless tobacco: Never  Vaping Use   Vaping Use: Never used  Substance Use Topics   Alcohol use: Yes    Alcohol/week: 0.0 standard drinks    Comment: rarely   Drug use: No     Medication list has been reviewed and updated.  Current Meds  Medication Sig   albuterol (VENTOLIN HFA) 108 (90 Base) MCG/ACT inhaler Inhale 2 puffs into the lungs every 6 (six) hours as needed for  wheezing or shortness of breath.   amLODipine (NORVASC) 10 MG tablet Take 1 tablet (10 mg total) by mouth daily.   cefdinir (OMNICEF) 300 MG capsule Take 1 capsule (300 mg total) by mouth 2 (two) times daily for 10 days.   cloNIDine (CATAPRES) 0.1 MG tablet Take 1 tablet (0.1 mg total) by mouth 2 (two) times daily.   fluticasone (FLONASE) 50 MCG/ACT nasal spray Place 2 sprays into both nostrils daily.   gabapentin (NEURONTIN) 300 MG capsule Take 300 mg by mouth at bedtime.   irbesartan (AVAPRO) 300 MG tablet Take 1 tablet (300 mg total) by mouth daily.   naproxen sodium (ALEVE) 220 MG tablet Take 220 mg by mouth daily as needed.   rosuvastatin (CRESTOR) 40 MG tablet  Take 1 tablet (40 mg total) by mouth daily.   [DISCONTINUED] allopurinol (ZYLOPRIM) 100 MG tablet Take 2 tablets by mouth once daily   [DISCONTINUED] atorvastatin (LIPITOR) 20 MG tablet Take 20 mg by mouth daily.    PHQ 2/9 Scores 08/19/2021 06/30/2021 06/30/2021 04/02/2021  PHQ - 2 Score 0 0 0 0  PHQ- 9 Score 0 0 0 0    GAD 7 : Generalized Anxiety Score 08/19/2021 06/30/2021 06/30/2021 04/02/2021  Nervous, Anxious, on Edge 0 0 0 0  Control/stop worrying 0 0 0 0  Worry too much - different things 0 0 0 0  Trouble relaxing 0 0 0 0  Restless 0 0 0 0  Easily annoyed or irritable 0 0 0 0  Afraid - awful might happen 0 0 0 0  Total GAD 7 Score 0 0 0 0  Anxiety Difficulty Not difficult at all Not difficult at all Not difficult at all Not difficult at all    BP Readings from Last 3 Encounters:  08/19/21 138/88  06/30/21 138/74  04/02/21 (!) 148/90    Physical Exam Constitutional:      Appearance: He is well-developed.  HENT:     Right Ear: Ear canal and external ear normal. Tympanic membrane is retracted. Tympanic membrane is not erythematous.     Left Ear: Ear canal and external ear normal. Tympanic membrane is retracted. Tympanic membrane is not erythematous.     Nose:     Right Sinus: Maxillary sinus tenderness present. No frontal sinus tenderness.     Left Sinus: Maxillary sinus tenderness present. No frontal sinus tenderness.     Mouth/Throat:     Mouth: No oral lesions.     Pharynx: Uvula midline. Posterior oropharyngeal erythema present. No oropharyngeal exudate.  Cardiovascular:     Rate and Rhythm: Normal rate and regular rhythm.     Heart sounds: Normal heart sounds.  Pulmonary:     Breath sounds: Normal breath sounds. No wheezing or rales.  Musculoskeletal:     Right lower leg: No edema.     Left lower leg: No edema.  Lymphadenopathy:     Cervical: No cervical adenopathy.  Neurological:     General: No focal deficit present.     Mental Status: He is alert and oriented  to person, place, and time.    Wt Readings from Last 3 Encounters:  08/19/21 197 lb 12.8 oz (89.7 kg)  06/30/21 198 lb (89.8 kg)  04/02/21 199 lb 3.2 oz (90.4 kg)    BP 138/88 (BP Location: Right Arm, Cuff Size: Large)    Pulse (!) 115    Temp 97.7 F (36.5 C)    Ht $R'5\' 5"'sx$  (1.651 m)    Wt 197  lb 12.8 oz (89.7 kg)    SpO2 98%    BMI 32.92 kg/m   Assessment and Plan: 1. Acute non-recurrent maxillary sinusitis Continue Mucinex-DM Add Coricidin HBP, fluids - cefdinir (OMNICEF) 300 MG capsule; Take 1 capsule (300 mg total) by mouth 2 (two) times daily for 10 days.  Dispense: 20 capsule; Refill: 0  2. Essential (primary) hypertension Clinically stable exam with well controlled BP. Tolerating medications without side effects at this time. Pt to continue current regimen and low sodium diet; benefits of regular exercise as able discussed.  3. Idiopathic gout, unspecified chronicity, unspecified site Continue daily preventative - allopurinol (ZYLOPRIM) 100 MG tablet; Take 2 tablets (200 mg total) by mouth daily.  Dispense: 180 tablet; Refill: 3   Partially dictated using Editor, commissioning. Any errors are unintentional.  Halina Maidens, MD [facility]  08/19/2021

## 2021-09-29 ENCOUNTER — Encounter: Payer: Self-pay | Admitting: Internal Medicine

## 2021-10-28 ENCOUNTER — Ambulatory Visit (INDEPENDENT_AMBULATORY_CARE_PROVIDER_SITE_OTHER): Payer: Medicare HMO | Admitting: Internal Medicine

## 2021-10-28 ENCOUNTER — Encounter: Payer: Self-pay | Admitting: Internal Medicine

## 2021-10-28 ENCOUNTER — Other Ambulatory Visit: Payer: Self-pay

## 2021-10-28 VITALS — BP 138/82 | HR 86 | Temp 97.8°F | Ht 65.0 in | Wt 200.8 lb

## 2021-10-28 DIAGNOSIS — J0100 Acute maxillary sinusitis, unspecified: Secondary | ICD-10-CM | POA: Diagnosis not present

## 2021-10-28 DIAGNOSIS — M5442 Lumbago with sciatica, left side: Secondary | ICD-10-CM | POA: Diagnosis not present

## 2021-10-28 DIAGNOSIS — G8929 Other chronic pain: Secondary | ICD-10-CM | POA: Diagnosis not present

## 2021-10-28 MED ORDER — AMOXICILLIN 875 MG PO TABS: 875.0000 mg | ORAL_TABLET | Freq: Two times a day (BID) | ORAL | 0 refills | Status: AC

## 2021-10-28 MED ORDER — GABAPENTIN 300 MG PO CAPS: 300.0000 mg | ORAL_CAPSULE | Freq: Every day | ORAL | 0 refills | Status: DC

## 2021-10-28 NOTE — Progress Notes (Signed)
? ? ?Date:  10/28/2021  ? ?Name:  Edward Rogers   DOB:  06-13-1946   MRN:  [id number] ? ? ?Chief Complaint: Sinus Problem ? ?Sinus Problem ?This is a recurrent problem. The current episode started more than 1 month ago. The problem is unchanged. There has been no fever. The pain is moderate. Associated symptoms include congestion, coughing, diaphoresis and sinus pressure. Pertinent negatives include no chills or shortness of breath.  ? ?Lab Results  ?Component Value Date  ? NA 142 06/30/2021  ? K 4.4 06/30/2021  ? CO2 23 06/30/2021  ? GLUCOSE 103 (H) 06/30/2021  ? BUN 17 06/30/2021  ? CREATININE 1.49 (H) 06/30/2021  ? CALCIUM 9.6 06/30/2021  ? EGFR 48 (L) 06/30/2021  ? GFRNONAA 40 (L) 07/07/2020  ? ?Lab Results  ?Component Value Date  ? CHOL 192 06/30/2021  ? HDL 26 (L) 06/30/2021  ? LDLCALC 106 (H) 06/30/2021  ? TRIG 349 (H) 06/30/2021  ? CHOLHDL 7.4 (H) 06/30/2021  ? ?Lab Results  ?Component Value Date  ? TSH 1.200 06/30/2021  ? ?Lab Results  ?Component Value Date  ? HGBA1C 6.3 (H) 06/30/2021  ? ?Lab Results  ?Component Value Date  ? WBC 4.8 06/30/2021  ? HGB 14.3 06/30/2021  ? HCT 45.0 06/30/2021  ? MCV 84 06/30/2021  ? PLT 259 06/30/2021  ? ?Lab Results  ?Component Value Date  ? ALT 15 06/30/2021  ? AST 20 06/30/2021  ? ALKPHOS 96 06/30/2021  ? BILITOT 0.4 06/30/2021  ? ?No results found for: 25OHVITD2, [place], VD25OH  ? ?Review of Systems  ?Constitutional:  Positive for diaphoresis. Negative for chills, fatigue and fever.  ?HENT:  Positive for congestion, postnasal drip and sinus pressure. Negative for trouble swallowing.   ?Respiratory:  Positive for cough. Negative for chest tightness, shortness of breath and wheezing.   ?Cardiovascular:  Negative for leg swelling.  ? ?Patient Active Problem List  ? Diagnosis Date Noted  ? Prediabetes 07/01/2021  ? Lumbar radiculopathy 07/17/2020  ? Aortic atherosclerosis ([place]) 07/06/2020  ? Chronic kidney disease, stage 3a ([place]) 07/06/2020  ? Primary osteoarthritis of  right shoulder 08/01/2018  ? Arthritis of joint of toe 06/01/2017  ? Chronic left-sided low back pain with left-sided sciatica 11/30/2016  ? Gout 04/11/2015  ? Dyslipidemia 04/11/2015  ? Essential (primary) hypertension 04/11/2015  ? Sarcoidosis 04/11/2015  ? Other allergic rhinitis 04/11/2015  ? Tobacco use disorder, moderate, in sustained remission 04/11/2015  ? Elevated PSA 04/11/2015  ? ? ?No Known Allergies ? ?Past Surgical History:  ?Procedure Laterality Date  ? COLONOSCOPY  2009  ? normal  ? PROSTATE BIOPSY  2014  ? normal  ? ? ?Social History  ? ?Tobacco Use  ? Smoking status: Former  ?  Packs/day: 0.50  ?  Years: 30.00  ?  Pack years: 15.00  ?  Types: Cigarettes  ?  Quit date: 08/30/2012  ?  Years since quitting: 9.1  ? Smokeless tobacco: Never  ?Vaping Use  ? Vaping Use: Never used  ?Substance Use Topics  ? Alcohol use: Yes  ?  Alcohol/week: 0.0 standard drinks  ?  Comment: rarely  ? Drug use: No  ? ? ? ?Medication list has been reviewed and updated. ? ?Current Meds  ?Medication Sig  ? albuterol (VENTOLIN HFA) 108 (90 Base) MCG/ACT inhaler Inhale 2 puffs into the lungs every 6 (six) hours as needed for wheezing or shortness of breath.  ? allopurinol (ZYLOPRIM) 100 MG tablet Take 2 tablets (200  mg total) by mouth daily.  ? amLODipine (NORVASC) 10 MG tablet Take 1 tablet (10 mg total) by mouth daily.  ? amoxicillin (AMOXIL) 875 MG tablet Take 1 tablet (875 mg total) by mouth 2 (two) times daily for 14 days.  ? cloNIDine (CATAPRES) 0.1 MG tablet Take 1 tablet (0.1 mg total) by mouth 2 (two) times daily.  ? fluticasone (FLONASE) 50 MCG/ACT nasal spray Place 2 sprays into both nostrils daily.  ? irbesartan (AVAPRO) 300 MG tablet Take 1 tablet (300 mg total) by mouth daily.  ? naproxen sodium (ALEVE) 220 MG tablet Take 220 mg by mouth daily as needed.  ? rosuvastatin (CRESTOR) 40 MG tablet Take 1 tablet (40 mg total) by mouth daily.  ? [DISCONTINUED] gabapentin (NEURONTIN) 300 MG capsule Take 300 mg by mouth at  bedtime.  ? ? ?PHQ 2/9 Scores 10/28/2021 08/19/2021 06/30/2021 06/30/2021  ?PHQ - 2 Score 0 0 0 0  ?PHQ- 9 Score 0 0 0 0  ? ? ?GAD 7 : Generalized Anxiety Score 10/28/2021 08/19/2021 06/30/2021 06/30/2021  ?Nervous, Anxious, on Edge 0 0 0 0  ?Control/stop worrying 0 0 0 0  ?Worry too much - different things 0 0 0 0  ?Trouble relaxing 0 0 0 0  ?Restless 0 0 0 0  ?Easily annoyed or irritable 0 0 0 0  ?Afraid - awful might happen 0 0 0 0  ?Total GAD 7 Score 0 0 0 0  ?Anxiety Difficulty Not difficult at all Not difficult at all Not difficult at all Not difficult at all  ? ? ?BP Readings from Last 3 Encounters:  ?10/28/21 138/82  ?08/19/21 138/88  ?06/30/21 138/74  ? ? ?Physical Exam ?Constitutional:   ?   Appearance: Normal appearance. He is well-developed.  ?HENT:  ?   Right Ear: Ear canal and external ear normal. Tympanic membrane is not erythematous or retracted.  ?   Left Ear: Ear canal and external ear normal. Tympanic membrane is not erythematous or retracted.  ?   Nose:  ?   Right Sinus: Maxillary sinus tenderness present. No frontal sinus tenderness.  ?   Left Sinus: Maxillary sinus tenderness present. No frontal sinus tenderness.  ?   Mouth/Throat:  ?   Mouth: No oral lesions.  ?   Pharynx: Uvula midline.  ?Cardiovascular:  ?   Rate and Rhythm: Normal rate and regular rhythm.  ?   Heart sounds: Normal heart sounds.  ?Pulmonary:  ?   Breath sounds: Normal breath sounds. No wheezing or rales.  ?Musculoskeletal:  ?   Cervical back: Normal range of motion.  ?Lymphadenopathy:  ?   Cervical: No cervical adenopathy.  ?Neurological:  ?   Mental Status: He is alert and oriented to person, place, and time.  ? ? ?Wt Readings from Last 3 Encounters:  ?10/28/21 200 lb 12.8 oz (91.1 kg)  ?08/19/21 197 lb 12.8 oz (89.7 kg)  ?06/30/21 198 lb (89.8 kg)  ? ? ?BP 138/82 (BP Location: Right Arm, Cuff Size: Large)   Pulse 86   Temp 97.8 ?F (36.6 ?C) (Oral)   Ht 5' 5" (1.651 m)   Wt 200 lb 12.8 oz (91.1 kg)   SpO2 97%   BMI 33.41  kg/m?  ? ?Assessment and Plan: ?1. Acute non-recurrent maxillary sinusitis ?Continue Flonase and Coricidin HBP ?If sx recur, would recommend ENT evaluation ?- amoxicillin (AMOXIL) 875 MG tablet; Take 1 tablet (875 mg total) by mouth 2 (two) times daily for 14 days.  Dispense:  28 tablet; Refill: 0 ? ?2. Chronic left-sided low back pain with left-sided sciatica ?- gabapentin (NEURONTIN) 300 MG capsule; Take 1 capsule (300 mg total) by mouth at bedtime.  Dispense: 90 capsule; Refill: 0 ? ? ?Partially dictated using Editor, commissioning. Any errors are unintentional. ? ?Halina Maidens, MD ?[facility] ?[facility] ? ?10/28/2021 ? ? ? ? ? ?

## 2021-12-28 ENCOUNTER — Ambulatory Visit (INDEPENDENT_AMBULATORY_CARE_PROVIDER_SITE_OTHER): Payer: Medicare HMO | Admitting: Internal Medicine

## 2021-12-28 ENCOUNTER — Encounter: Payer: Self-pay | Admitting: Internal Medicine

## 2021-12-28 VITALS — BP 134/84 | HR 88 | Ht 65.0 in | Wt 199.8 lb

## 2021-12-28 DIAGNOSIS — I700 Atherosclerosis of aorta: Secondary | ICD-10-CM | POA: Diagnosis not present

## 2021-12-28 DIAGNOSIS — J3089 Other allergic rhinitis: Secondary | ICD-10-CM

## 2021-12-28 DIAGNOSIS — N1831 Chronic kidney disease, stage 3a: Secondary | ICD-10-CM

## 2021-12-28 DIAGNOSIS — R7303 Prediabetes: Secondary | ICD-10-CM | POA: Diagnosis not present

## 2021-12-28 DIAGNOSIS — E785 Hyperlipidemia, unspecified: Secondary | ICD-10-CM | POA: Diagnosis not present

## 2021-12-28 DIAGNOSIS — I10 Essential (primary) hypertension: Secondary | ICD-10-CM

## 2021-12-28 LAB — POCT URINALYSIS DIPSTICK
Bilirubin, UA: NEGATIVE
Blood, UA: NEGATIVE
Glucose, UA: NEGATIVE
Ketones, UA: NEGATIVE
Leukocytes, UA: NEGATIVE
Nitrite, UA: NEGATIVE
Protein, UA: POSITIVE — AB
Spec Grav, UA: 1.025 (ref 1.010–1.025)
Urobilinogen, UA: 0.2 E.U./dL
pH, UA: 5 (ref 5.0–8.0)

## 2021-12-28 MED ORDER — AZELASTINE HCL 0.1 % NA SOLN: 2.0000 | Freq: Two times a day (BID) | NASAL | 1 refills | Status: DC

## 2021-12-28 MED ORDER — ROSUVASTATIN CALCIUM 40 MG PO TABS: 40.0000 mg | ORAL_TABLET | Freq: Every day | ORAL | 1 refills | Status: DC

## 2021-12-28 MED ORDER — AMLODIPINE BESYLATE 10 MG PO TABS: 10.0000 mg | ORAL_TABLET | Freq: Every day | ORAL | 1 refills | Status: DC

## 2021-12-28 MED ORDER — IRBESARTAN 300 MG PO TABS: 300.0000 mg | ORAL_TABLET | Freq: Every day | ORAL | 1 refills | Status: DC

## 2021-12-28 NOTE — Progress Notes (Signed)
? ? ?Date:  12/28/2021  ? ?Name:  Edward Rogers   DOB:  27-Nov-1945   MRN:  [id number] ? ? ?Chief Complaint: Diabetes, Hypertension, Hyperlipidemia, and Chronic Kidney Disease ? ?Hypertension ?This is a chronic problem. The problem is controlled. Pertinent negatives include no chest pain, headaches, palpitations or shortness of breath. Past treatments include calcium channel blockers, direct vasodilators and angiotensin blockers.  ?Diabetes ?He presents for his follow-up diabetic visit. Diabetes type: prediabetes. His disease course has been stable. Pertinent negatives for hypoglycemia include no dizziness or headaches. Pertinent negatives for diabetes include no chest pain, no fatigue and no weakness. Current diabetic treatment includes diet.  ?Hyperlipidemia ?This is a chronic problem. The problem is resistant. Pertinent negatives include no chest pain or shortness of breath. Current antihyperlipidemic treatment includes statins. The current treatment provides moderate improvement of lipids.  ?CKD - stable GFR around 45-50.  On Allopurinol for gout and naproxen prn.  Trying to limit NSAIDS to avoid progression. ? ?Lab Results  ?Component Value Date  ? NA 142 06/30/2021  ? K 4.4 06/30/2021  ? CO2 23 06/30/2021  ? GLUCOSE 103 (H) 06/30/2021  ? BUN 17 06/30/2021  ? CREATININE 1.49 (H) 06/30/2021  ? CALCIUM 9.6 06/30/2021  ? EGFR 48 (L) 06/30/2021  ? GFRNONAA 40 (L) 07/07/2020  ? ?Lab Results  ?Component Value Date  ? CHOL 192 06/30/2021  ? HDL 26 (L) 06/30/2021  ? LDLCALC 106 (H) 06/30/2021  ? TRIG 349 (H) 06/30/2021  ? CHOLHDL 7.4 (H) 06/30/2021  ? ?Lab Results  ?Component Value Date  ? TSH 1.200 06/30/2021  ? ?Lab Results  ?Component Value Date  ? HGBA1C 6.3 (H) 06/30/2021  ? ?Lab Results  ?Component Value Date  ? WBC 4.8 06/30/2021  ? HGB 14.3 06/30/2021  ? HCT 45.0 06/30/2021  ? MCV 84 06/30/2021  ? PLT 259 06/30/2021  ? ?Lab Results  ?Component Value Date  ? ALT 15 06/30/2021  ? AST 20 06/30/2021  ? ALKPHOS 96  06/30/2021  ? BILITOT 0.4 06/30/2021  ? ?No results found for: 25OHVITD2, [place], VD25OH  ? ?Review of Systems  ?Constitutional:  Negative for fatigue and unexpected weight change.  ?HENT:  Positive for congestion and sinus pressure. Negative for nosebleeds and trouble swallowing.   ?Eyes:  Negative for visual disturbance.  ?Respiratory:  Negative for cough, chest tightness, shortness of breath and wheezing.   ?Cardiovascular:  Negative for chest pain, palpitations and leg swelling.  ?Gastrointestinal:  Negative for abdominal pain, constipation and diarrhea.  ?Neurological:  Negative for dizziness, weakness, light-headedness and headaches.  ? ?Patient Active Problem List  ? Diagnosis Date Noted  ? Prediabetes 07/01/2021  ? Lumbar radiculopathy 07/17/2020  ? Aortic atherosclerosis ([place]) 07/06/2020  ? Chronic kidney disease, stage 3a ([place]) 07/06/2020  ? 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 Arthritis of joint of toe 06/01/2017  ? Chronic left-sided low back pain with left-sided sciatica 11/30/2016  ? Gout 04/11/2015  ? Dyslipidemia 04/11/2015  ? Essential (primary) hypertension 04/11/2015  ? Sarcoidosis 04/11/2015  ? Other allergic rhinitis 04/11/2015  ? Tobacco use disorder, moderate, in sustained remission 04/11/2015  ? Elevated PSA 04/11/2015  ? ? ?No Known Allergies ? ?Past Surgical History:  ?Procedure Laterality Date  ? COLONOSCOPY  2009  ? normal  ? PROSTATE BIOPSY  2014  ? normal  ? ? ?Social History  ? ?Tobacco Use  ? Smoking status: Former  ?  Packs/day: 0.50  ?  Years: 30.00  ?  Pack years: 15.00  ?  Types: Cigarettes  ?  Quit date: 08/30/2012  ?  Years since quitting: 9.3  ? Smokeless tobacco: Never  ?Vaping Use  ? Vaping Use: Never used  ?Substance Use Topics  ? Alcohol use: Yes  ?  Alcohol/week: 0.0 standard drinks  ?  Comment: rarely  ? Drug use: No  ? ? ? ?Medication list has been reviewed and updated. ? ?Current Meds  ?Medication Sig  ? albuterol (VENTOLIN HFA) 108 (90 Base) MCG/ACT  inhaler Inhale 2 puffs into the lungs every 6 (six) hours as needed for wheezing or shortness of breath.  ? allopurinol (ZYLOPRIM) 100 MG tablet Take 2 tablets (200 mg total) by mouth daily.  ? azelastine (ASTELIN) 0.1 % nasal spray Place 2 sprays into both nostrils 2 (two) times daily. Use in each nostril as directed  ? cloNIDine (CATAPRES) 0.1 MG tablet Take 1 tablet (0.1 mg total) by mouth 2 (two) times daily.  ? fluticasone (FLONASE) 50 MCG/ACT nasal spray Place 2 sprays into both nostrils daily.  ? gabapentin (NEURONTIN) 300 MG capsule Take 1 capsule (300 mg total) by mouth at bedtime.  ? [DISCONTINUED] amLODipine (NORVASC) 10 MG tablet Take 1 tablet (10 mg total) by mouth daily.  ? [DISCONTINUED] irbesartan (AVAPRO) 300 MG tablet Take 1 tablet (300 mg total) by mouth daily.  ? [DISCONTINUED] rosuvastatin (CRESTOR) 40 MG tablet Take 1 tablet (40 mg total) by mouth daily.  ? ? ? ?  12/28/2021  ?  9:53 AM 10/28/2021  ?  9:42 AM 08/19/2021  ?  8:43 AM 06/30/2021  ? 10:10 AM  ?GAD 7 : Generalized Anxiety Score  ?Nervous, Anxious, on Edge 0 0 0 0  ?Control/stop worrying 0 0 0 0  ?Worry too much - different things 0 0 0 0  ?Trouble relaxing 0 0 0 0  ?Restless 0 0 0 0  ?Easily annoyed or irritable 0 0 0 0  ?Afraid - awful might happen 0 0 0 0  ?Total GAD 7 Score 0 0 0 0  ?Anxiety Difficulty Not difficult at all Not difficult at all Not difficult at all Not difficult at all  ? ? ? ?  12/28/2021  ?  9:53 AM  ?Depression screen PHQ 2/9  ?Decreased Interest 0  ?Down, Depressed, Hopeless 0  ?PHQ - 2 Score 0  ?Altered sleeping 0  ?Tired, decreased energy 0  ?Change in appetite 0  ?Feeling bad or failure about yourself  0  ?Trouble concentrating 0  ?Moving slowly or fidgety/restless 0  ?Suicidal thoughts 0  ?PHQ-9 Score 0  ?Difficult doing work/chores Not difficult at all  ? ? ?BP Readings from Last 3 Encounters:  ?12/28/21 134/84  ?10/28/21 138/82  ?08/19/21 138/88  ? ? ?Physical Exam ?Vitals and nursing note reviewed.   ?Constitutional:   ?   General: He is not in acute distress. ?   Appearance: Normal appearance. He is well-developed.  ?HENT:  ?   Head: Normocephalic and atraumatic.  ?   Nose:  ?   Right Sinus: No maxillary sinus tenderness or frontal sinus tenderness.  ?   Left Sinus: No maxillary sinus tenderness or frontal sinus tenderness.  ?Cardiovascular:  ?   Rate and Rhythm: Normal rate and regular rhythm.  ?   Pulses: Normal pulses.  ?   Heart sounds: No murmur heard. ?Pulmonary:  ?   Effort: Pulmonary effort is normal. No respiratory distress.  ?   Breath sounds: No wheezing or  rhonchi.  ?Musculoskeletal:  ?   Cervical back: Normal range of motion.  ?Lymphadenopathy:  ?   Cervical: No cervical adenopathy.  ?Skin: ?   General: Skin is warm and dry.  ?   Findings: No rash.  ?Neurological:  ?   Mental Status: He is alert and oriented to person, place, and time.  ?Psychiatric:     ?   Mood and Affect: Mood normal.     ?   Behavior: Behavior normal.  ? ? ?Wt Readings from Last 3 Encounters:  ?12/28/21 199 lb 12.8 oz (90.6 kg)  ?10/28/21 200 lb 12.8 oz (91.1 kg)  ?08/19/21 197 lb 12.8 oz (89.7 kg)  ? ? ?BP 134/84 (BP Location: Right Arm, Cuff Size: Large)   Pulse 88   Ht $R'5\' 5"'vT$  (1.651 m)   Wt 199 lb 12.8 oz (90.6 kg)   SpO2 95%   BMI 33.25 kg/m?  ? ?Assessment and Plan: ?1. Essential (primary) hypertension ?Clinically stable exam with well controlled BP. ?Tolerating medications without side effects at this time. ?Pt to continue current regimen and low sodium diet; benefits of regular exercise as able discussed. ?- POCT urinalysis dipstick ?- amLODipine (NORVASC) 10 MG tablet; Take 1 tablet (10 mg total) by mouth daily.  Dispense: 90 tablet; Refill: 1 ?- irbesartan (AVAPRO) 300 MG tablet; Take 1 tablet (300 mg total) by mouth daily.  Dispense: 90 tablet; Refill: 1 ? ?2. Dyslipidemia ?Tolerating statin therapy. ?- rosuvastatin (CRESTOR) 40 MG tablet; Take 1 tablet (40 mg total) by mouth daily.  Dispense: 90 tablet; Refill:  1 ? ?3. Prediabetes ?He has cut out all sugar from his diet and feels well. ?Weight is stable. ?- Hemoglobin A1c ? ?4. Chronic kidney disease, stage 3a ([place]) ?Continue to limit use of nsaids ?Check labs today ?Clarise Cruz

## 2021-12-29 LAB — BASIC METABOLIC PANEL
BUN/Creatinine Ratio: 13 (ref 10–24)
BUN: 20 mg/dL (ref 8–27)
CO2: 22 mmol/L (ref 20–29)
Calcium: 9.4 mg/dL (ref 8.6–10.2)
Chloride: 103 mmol/L (ref 96–106)
Creatinine, Ser: 1.53 mg/dL — ABNORMAL HIGH (ref 0.76–1.27)
Glucose: 104 mg/dL — ABNORMAL HIGH (ref 70–99)
Potassium: 3.9 mmol/L (ref 3.5–5.2)
Sodium: 142 mmol/L (ref 134–144)
eGFR: 46 mL/min/{1.73_m2} — ABNORMAL LOW (ref >59)

## 2021-12-29 LAB — HEMOGLOBIN A1C
Est. average glucose Bld gHb Est-mCnc: 137 mg/dL
Hgb A1c MFr Bld: 6.4 % — ABNORMAL HIGH (ref 4.8–5.6)

## 2022-01-31 ENCOUNTER — Other Ambulatory Visit: Payer: Self-pay | Admitting: Internal Medicine

## 2022-01-31 DIAGNOSIS — D869 Sarcoidosis, unspecified: Secondary | ICD-10-CM

## 2022-02-01 NOTE — Telephone Encounter (Signed)
Requested Prescriptions  Pending Prescriptions Disp Refills  . albuterol (VENTOLIN HFA) 108 (90 Base) MCG/ACT inhaler [Pharmacy Med Name: Albuterol Sulfate HFA 108 (90 Base) MCG/ACT Inhalation Aerosol Solution] 18 g 0    Sig: INHALE 2 PUFFS BY MOUTH EVERY 6 HOURS AS NEEDED FOR WHEEZING FOR SHORTNESS OF BREATH     Pulmonology:  [facility] Agonists 2 Passed - 01/31/2022 11:33 AM      Passed - Last BP in normal range    BP Readings from Last 1 Encounters:  12/28/21 134/84         Passed - Last Heart Rate in normal range    Pulse Readings from Last 1 Encounters:  12/28/21 88         Passed - Valid encounter within last 12 months    Recent Outpatient Visits          1 month ago Essential (primary) hypertension   [facility] Reubin Milan, MD   3 months ago Acute non-recurrent maxillary sinusitis   [facility] Reubin Milan, MD   5 months ago Acute non-recurrent maxillary sinusitis   [facility] Reubin Milan, MD   7 months ago Annual physical exam   [facility] Reubin Milan, MD   10 months ago Essential (primary) hypertension   [facility] Reubin Milan, MD      Future Appointments            In 4 months Judithann Graves, Nyoka Cowden, MD [facility], [facility]

## 2022-03-09 ENCOUNTER — Encounter: Payer: Medicare HMO | Admitting: Family

## 2022-04-19 NOTE — Progress Notes (Unsigned)
Subjective:   Edward Rogers is a 76 y.o. male who presents for Medicare Annual/Subsequent preventive examination.  I connected with  Edward Rogers on 04/20/22 by a audio enabled telemedicine application and verified that I am speaking with the correct person using two identifiers.  Patient Location: Home  Provider Location: Office/Clinic  I discussed the limitations of evaluation and management by telemedicine. The patient expressed understanding and agreed to proceed.   Review of Systems    Defer to PCP Cardiac Risk Factors include: advanced age (>56men, >61 women);obesity (BMI >30kg/m2)     Objective:    Today's Vitals   04/20/22 0819  Weight: 190 lb (86.2 kg)  Height: 5\' 5"  (1.651 m)  PainSc: 3    Body mass index is 31.62 kg/m.     04/20/2022    8:24 AM 03/08/2021   10:30 AM 11/30/2016   10:12 AM 11/04/2015    8:41 AM  Advanced Directives  Does Patient Have a Medical Advance Directive? Yes No No No  Type of 01/04/2016 of [place];Living will     Copy of Healthcare Power of Attorney in Chart? No - copy requested     Would patient like information on creating a medical advance directive?  No - Patient declined  No - patient declined information    Current Medications (verified) Outpatient Encounter Medications as of 04/20/2022  Medication Sig   albuterol (VENTOLIN HFA) 108 (90 Base) MCG/ACT inhaler INHALE 2 PUFFS BY MOUTH EVERY 6 HOURS AS NEEDED FOR WHEEZING FOR SHORTNESS OF BREATH   azelastine (ASTELIN) 0.1 % nasal spray Place 2 sprays into both nostrils 2 (two) times daily. Use in each nostril as directed   cloNIDine (CATAPRES) 0.1 MG tablet Take 1 tablet (0.1 mg total) by mouth 2 (two) times daily.   fluticasone (FLONASE) 50 MCG/ACT nasal spray Place 2 sprays into both nostrils daily.   naproxen sodium (ALEVE) 220 MG tablet Take 220 mg by mouth daily as needed.   [DISCONTINUED] allopurinol (ZYLOPRIM) 100 MG tablet Take 2 tablets  (200 mg total) by mouth daily.   [DISCONTINUED] amLODipine (NORVASC) 10 MG tablet Take 1 tablet (10 mg total) by mouth daily.   [DISCONTINUED] gabapentin (NEURONTIN) 300 MG capsule Take 1 capsule (300 mg total) by mouth at bedtime.   [DISCONTINUED] irbesartan (AVAPRO) 300 MG tablet Take 1 tablet (300 mg total) by mouth daily.   [DISCONTINUED] rosuvastatin (CRESTOR) 40 MG tablet Take 1 tablet (40 mg total) by mouth daily.   allopurinol (ZYLOPRIM) 100 MG tablet Take 2 tablets (200 mg total) by mouth daily.   amLODipine (NORVASC) 10 MG tablet Take 1 tablet (10 mg total) by mouth daily.   gabapentin (NEURONTIN) 300 MG capsule Take 1 capsule (300 mg total) by mouth at bedtime.   irbesartan (AVAPRO) 300 MG tablet Take 1 tablet (300 mg total) by mouth daily.   rosuvastatin (CRESTOR) 40 MG tablet Take 1 tablet (40 mg total) by mouth daily.   No facility-administered encounter medications on file as of 04/20/2022.    Allergies (verified) Patient has no known allergies.   History: Past Medical History:  Diagnosis Date   Hypertension    Past Surgical History:  Procedure Laterality Date   COLONOSCOPY  2009   normal   PROSTATE BIOPSY  2014   normal   Family History  Problem Relation Age of Onset   Cancer Mother    Heart failure Father    Social History   Socioeconomic History  Marital status: Single    Spouse name: Not on file   Number of children: 3   Years of education: Not on file   Highest education level: High school graduate  Occupational History   Not on file  Tobacco Use   Smoking status: Former    Packs/day: 0.50    Years: 30.00    Total pack years: 15.00    Types: Cigarettes    Quit date: 08/30/2012    Years since quitting: 9.6   Smokeless tobacco: Never  Vaping Use   Vaping Use: Never used  Substance and Sexual Activity   Alcohol use: Yes    Alcohol/week: 0.0 standard drinks of alcohol    Comment: rarely   Drug use: No   Sexual activity: Not Currently  Other  Topics Concern   Not on file  Social History Narrative   Pt lives alone   Social Determinants of Health   Financial Resource Strain: Low Risk  (04/20/2022)   Overall Financial Resource Strain (CARDIA)    Difficulty of Paying Living Expenses: Not hard at all  Food Insecurity: No Food Insecurity (04/20/2022)   Hunger Vital Sign    Worried About Running Out of Food in the Last Year: Never true    Ran Out of Food in the Last Year: Never true  Transportation Needs: No Transportation Needs (04/20/2022)   PRAPARE - Administrator, Civil Service (Medical): No    Lack of Transportation (Non-Medical): No  Physical Activity: Sufficiently Active (04/20/2022)   Exercise Vital Sign    Days of Exercise per Week: 5 days    Minutes of Exercise per Session: 60 min  Stress: No Stress Concern Present (04/20/2022)   [employer] of Occupational Health - Occupational Stress Questionnaire    Feeling of Stress : Only a little  Social Connections: Moderately Isolated (04/20/2022)   Social Connection and Isolation Panel [NHANES]    Frequency of Communication with Friends and Family: More than three times a week    Frequency of Social Gatherings with Friends and Family: Three times a week    Attends Religious Services: More than 4 times per year    Active Member of Clubs or Organizations: No    Attends Banker Meetings: Never    Marital Status: Divorced    Tobacco Counseling Not required. Non-smoker.   Clinical Intake:  Pre-visit preparation completed: Yes  Pain : 0-10 Pain Score: 3  Pain Type: Acute pain Pain Location: Hand Pain Orientation: Right Pain Radiating Towards: N/A Pain Descriptors / Indicators: Aching, Constant, Throbbing Pain Onset: 1 to 4 weeks ago Pain Frequency: Occasional Pain Relieving Factors: Time Effect of Pain on Daily Activities: Hard to do anything with hand when gout flares up.  Pain Relieving Factors: Time  BMI - recorded:  31.62 Nutritional Status: BMI > 30  Obese Nutritional Risks: None Diabetes: No  How often do you need to have someone help you when you read instructions, pamphlets, or other written materials from your doctor or pharmacy?: 1 - Never  Diabetic? No.  Interpreter Needed?: No  Information entered by :: Margaretha Sheffield, CMA   Activities of Daily Living    04/20/2022    8:31 AM 12/28/2021    9:53 AM  In your present state of health, do you have any difficulty performing the following activities:  Hearing? 0 0  Vision? 0 0  Difficulty concentrating or making decisions? 0 0  Walking or climbing stairs? 0 0  Dressing or  bathing? 0 0  Doing errands, shopping? 0 0  Preparing Food and eating ? N   Using the Toilet? N   In the past six months, have you accidently leaked urine? N   Do you have problems with loss of bowel control? N   Managing your Medications? N   Managing your Finances? N   Housekeeping or managing your Housekeeping? N     Patient Care Team: Edward Milan, MD as PCP - General (Internal Medicine) Harle Battiest, PA-C as Physician Assistant (Urology)  Indicate any recent Medical Services you may have received from other than [facility] providers in the past year (date may be approximate).     Assessment:   This is a routine wellness examination for Edward Rogers.  Hearing/Vision screen Patient has difficulty seeing without the help of glasses. No hearing concerns at this time.  Dietary issues and exercise activities discussed: Current Exercise Habits: Home exercise routine, Type of exercise: Other - see comments (Exercise Bike), Time (Minutes): 60, Frequency (Times/Week): 5, Weekly Exercise (Minutes/Week): 300, Intensity: Mild, Exercise limited by: None identified   Goals Addressed             This Visit's Progress    DIET - INCREASE WATER INTAKE   On track    Recommend drinking 6-8 glasses of water per day       Depression Screen    04/20/2022    8:22 AM  12/28/2021    9:53 AM 10/28/2021    9:41 AM 08/19/2021    8:43 AM 06/30/2021   10:10 AM 06/30/2021   10:07 AM 04/02/2021    1:49 PM  PHQ 2/9 Scores  PHQ - 2 Score 0 0 0 0 0 0 0  PHQ- 9 Score  0 0 0 0 0 0    Fall Risk    04/20/2022    8:24 AM 12/28/2021    9:53 AM 10/28/2021    9:42 AM 08/19/2021    8:43 AM 06/30/2021   10:10 AM  Fall Risk   Falls in the past year? 0 0 0 0 0  Number falls in past yr: 0 0 0 0 0  Injury with Fall? 0 0 0 0 0  Risk for fall due to : No Fall Risks No Fall Risks No Fall Risks No Fall Risks No Fall Risks  Follow up  Falls evaluation completed Falls evaluation completed Falls evaluation completed Falls evaluation completed    FALL RISK PREVENTION PERTAINING TO THE HOME:  Any stairs in or around the home? Yes  6 steps to enter the house If so, are there any without handrails? Yes  Home free of loose throw rugs in walkways, pet beds, electrical cords, etc? Yes Adequate lighting in your home to reduce risk of falls? Yes   ASSISTIVE DEVICES UTILIZED TO PREVENT FALLS:  Life alert? No  Use of a cane, walker or w/c? No Grab bars in the bathroom? No  Shower chair or bench in shower? No  Elevated toilet seat or a handicapped toilet? No   TIMED UP AND GO:  Was the test performed? No .  Length of time to ambulate 10 feet: N/A sec.    Cognitive Function:        04/20/2022    8:25 AM 06/13/2018   10:26 AM 11/30/2016   10:14 AM  6CIT Screen  What Year? 0 points 0 points 0 points  What month? 0 points 0 points 0 points  What time? 0 points 0  points 0 points  Count back from 20 0 points 0 points 0 points  Months in reverse 0 points 0 points 0 points  Repeat phrase 0 points 2 points 2 points  Total Score 0 points 2 points 2 points    Immunizations Immunization History  Administered Date(s) Administered   Fluad Quad(high Dose 65+) 06/30/2021   Moderna Sars-Covid-2 Vaccination 11/23/2019, 12/26/2019, 09/08/2020   Pneumococcal Conjugate-13 11/04/2015    Pneumococcal Polysaccharide-23 08/31/2007, 06/13/2018   Zoster, Live 02/11/2008    TDAP status: Due, Education has been provided regarding the importance of this vaccine. Advised may receive this vaccine at local pharmacy or Health Dept. Aware to provide a copy of the vaccination record if obtained from local pharmacy or Health Dept. Verbalized acceptance and understanding.  Flu Vaccine status: Due, Education has been provided regarding the importance of this vaccine. Advised may receive this vaccine at local pharmacy or Health Dept. Aware to provide a copy of the vaccination record if obtained from local pharmacy or Health Dept. Verbalized acceptance and understanding.  Pneumococcal vaccine status: Up to date  Covid-19 vaccine status: Completed vaccines  Qualifies for Shingles Vaccine? Yes   Zostavax completed No   Shingrix Completed?: No.    Education has been provided regarding the importance of this vaccine. Patient has been advised to call insurance company to determine out of pocket expense if they have not yet received this vaccine. Advised may also receive vaccine at local pharmacy or Health Dept. Verbalized acceptance and understanding.  Screening Tests Health Maintenance  Topic Date Due   TETANUS/TDAP  Never done   Zoster Vaccines- Shingrix (1 of 2) Never done   COVID-19 Vaccine (4 - Moderna series) 11/03/2020   INFLUENZA VACCINE  03/29/2022   Pneumonia Vaccine 62+ Years old  Completed   Hepatitis C Screening  Completed   HPV VACCINES  Aged Out   COLONOSCOPY (Pts 45-51yrs Insurance coverage will need to be confirmed)  Discontinued    Health Maintenance  Health Maintenance Due  Topic Date Due   TETANUS/TDAP  Never done   Zoster Vaccines- Shingrix (1 of 2) Never done   COVID-19 Vaccine (4 - Moderna series) 11/03/2020   INFLUENZA VACCINE  03/29/2022    Colorectal cancer screening: No longer required.   Lung Cancer Screening: (Low Dose CT Chest recommended if Age 49-80  years, 30 pack-year currently smoking OR have quit w/in 15years.) does not qualify.   Lung Cancer Screening Referral: N/A  Additional Screening:  Hepatitis C Screening: does qualify; Completed 06/13/2018  Vision Screening: Recommended annual ophthalmology exams for early detection of glaucoma and other disorders of the eye. Is the patient up to date with their annual eye exam?  No  Who is the provider or what is the name of the office in which the patient attends annual eye exams?  [facility] If pt is not established with a provider, would they like to be referred to a provider to establish care? No .   Dental Screening: Recommended annual dental exams for proper oral hygiene  Community Resource Referral / Chronic Care Management: CRR required this visit?  No   CCM required this visit?  No      Plan:     I have personally reviewed and noted the following in the patient's chart:   Medical and social history Use of alcohol, tobacco or illicit drugs  Current medications and supplements including opioid prescriptions. Patient is not currently taking opioid prescriptions. Functional ability and status  Nutritional status Physical activity Advanced directives List of other physicians Hospitalizations, surgeries, and ER visits in previous 12 months Vitals Screenings to include cognitive, depression, and falls Referrals and appointments  In addition, I have reviewed and discussed with patient certain preventive protocols, quality metrics, and best practice recommendations. A written personalized care plan for preventive services as well as general preventive health recommendations were provided to patient.     Mariel SleetChassidy N Lamont Glasscock, CMA   04/20/2022   Nurse Notes:  None.              Mr. Julaine HuaCadlett , Thank you for taking time to come for your Medicare Wellness Visit. I appreciate your ongoing commitment to your health goals. Please review the following plan we  discussed and let me know if I can assist you in the future.   These are the goals we discussed:  Goals      DIET - INCREASE WATER INTAKE     Recommend drinking 6-8 glasses of water per day         This is a list of the screening recommended for you and due dates:  Health Maintenance  Topic Date Due   Tetanus Vaccine  Never done   Zoster (Shingles) Vaccine (1 of 2) Never done   COVID-19 Vaccine (4 - Moderna series) 11/03/2020   Flu Shot  03/29/2022   Pneumonia Vaccine  Completed   Hepatitis C Screening: USPSTF Recommendation to screen - Ages 18-79 yo.  Completed   HPV Vaccine  Aged Out   Colon Cancer Screening  Discontinued

## 2022-04-20 ENCOUNTER — Ambulatory Visit (INDEPENDENT_AMBULATORY_CARE_PROVIDER_SITE_OTHER): Payer: Medicare HMO

## 2022-04-20 VITALS — Ht 65.0 in | Wt 190.0 lb

## 2022-04-20 DIAGNOSIS — M5442 Lumbago with sciatica, left side: Secondary | ICD-10-CM

## 2022-04-20 DIAGNOSIS — I10 Essential (primary) hypertension: Secondary | ICD-10-CM | POA: Diagnosis not present

## 2022-04-20 DIAGNOSIS — G8929 Other chronic pain: Secondary | ICD-10-CM | POA: Diagnosis not present

## 2022-04-20 DIAGNOSIS — E785 Hyperlipidemia, unspecified: Secondary | ICD-10-CM

## 2022-04-20 DIAGNOSIS — M1000 Idiopathic gout, unspecified site: Secondary | ICD-10-CM

## 2022-04-20 DIAGNOSIS — Z0000 Encounter for general adult medical examination without abnormal findings: Secondary | ICD-10-CM

## 2022-04-20 MED ORDER — GABAPENTIN 300 MG PO CAPS: 300.0000 mg | ORAL_CAPSULE | Freq: Every day | ORAL | 0 refills | Status: DC

## 2022-04-20 MED ORDER — AMLODIPINE BESYLATE 10 MG PO TABS: 10.0000 mg | ORAL_TABLET | Freq: Every day | ORAL | 0 refills | Status: DC

## 2022-04-20 MED ORDER — ALLOPURINOL 100 MG PO TABS: 200.0000 mg | ORAL_TABLET | Freq: Every day | ORAL | 0 refills | Status: DC

## 2022-04-20 MED ORDER — ROSUVASTATIN CALCIUM 40 MG PO TABS: 40.0000 mg | ORAL_TABLET | Freq: Every day | ORAL | 0 refills | Status: DC

## 2022-04-20 MED ORDER — IRBESARTAN 300 MG PO TABS: 300.0000 mg | ORAL_TABLET | Freq: Every day | ORAL | 0 refills | Status: DC

## 2022-04-20 NOTE — Patient Instructions (Signed)
Edward Rogers , Thank you for taking time to come for your Medicare Wellness Visit. I appreciate your ongoing commitment to your health goals. Please review the following plan we discussed and let me know if I can assist you in the future.   These are the goals we discussed:  Goals      DIET - INCREASE WATER INTAKE     Recommend drinking 6-8 glasses of water per day         This is a list of the screening recommended for you and due dates:  Health Maintenance  Topic Date Due   Tetanus Vaccine  Never done   Zoster (Shingles) Vaccine (1 of 2) Never done   COVID-19 Vaccine (4 - Moderna series) 11/03/2020   Flu Shot  03/29/2022   Pneumonia Vaccine  Completed   Hepatitis C Screening: USPSTF Recommendation to screen - Ages 18-79 yo.  Completed   HPV Vaccine  Aged Out   Colon Cancer Screening  Discontinued

## 2022-06-30 ENCOUNTER — Encounter: Payer: Self-pay | Admitting: Internal Medicine

## 2022-06-30 ENCOUNTER — Ambulatory Visit (INDEPENDENT_AMBULATORY_CARE_PROVIDER_SITE_OTHER): Payer: Medicare HMO | Admitting: Internal Medicine

## 2022-06-30 VITALS — BP 158/78 | HR 81 | Ht 65.0 in | Wt 194.0 lb

## 2022-06-30 DIAGNOSIS — E785 Hyperlipidemia, unspecified: Secondary | ICD-10-CM

## 2022-06-30 DIAGNOSIS — R7303 Prediabetes: Secondary | ICD-10-CM

## 2022-06-30 DIAGNOSIS — I700 Atherosclerosis of aorta: Secondary | ICD-10-CM | POA: Diagnosis not present

## 2022-06-30 DIAGNOSIS — I10 Essential (primary) hypertension: Secondary | ICD-10-CM | POA: Diagnosis not present

## 2022-06-30 DIAGNOSIS — Z125 Encounter for screening for malignant neoplasm of prostate: Secondary | ICD-10-CM | POA: Diagnosis not present

## 2022-06-30 DIAGNOSIS — Z0000 Encounter for general adult medical examination without abnormal findings: Secondary | ICD-10-CM

## 2022-06-30 DIAGNOSIS — N1831 Chronic kidney disease, stage 3a: Secondary | ICD-10-CM | POA: Diagnosis not present

## 2022-06-30 DIAGNOSIS — Z23 Encounter for immunization: Secondary | ICD-10-CM | POA: Diagnosis not present

## 2022-06-30 LAB — POCT URINALYSIS DIPSTICK
Bilirubin, UA: NEGATIVE
Blood, UA: NEGATIVE
Glucose, UA: NEGATIVE
Ketones, UA: NEGATIVE
Leukocytes, UA: NEGATIVE
Nitrite, UA: NEGATIVE
Protein, UA: POSITIVE — AB
Spec Grav, UA: 1.025 (ref 1.010–1.025)
Urobilinogen, UA: 0.2 E.U./dL
pH, UA: 6 (ref 5.0–8.0)

## 2022-06-30 MED ORDER — SPIRONOLACTONE 25 MG PO TABS: 25.0000 mg | ORAL_TABLET | Freq: Every day | ORAL | 0 refills | Status: DC

## 2022-06-30 NOTE — Progress Notes (Signed)
Date:  06/30/2022   Name:  Edward Rogers   DOB:  Jan 14, 1946   MRN:  [id number]   Chief Complaint: Annual Exam Edward Rogers is a 76 y.o. male who presents today for his Complete Annual Exam. He feels well. He reports exercising regularly on exercise bike. He reports he is sleeping fairly well.   Colonoscopy: 11/2008  Immunization History  Administered Date(s) Administered   Fluad Quad(high Dose 65+) 06/30/2021   Moderna Sars-Covid-2 Vaccination 11/23/2019, 12/26/2019, 09/08/2020   Pneumococcal Conjugate-13 11/04/2015   Pneumococcal Polysaccharide-23 08/31/2007, 06/13/2018   Zoster, Live 02/11/2008   Health Maintenance Due  Topic Date Due   Zoster Vaccines- Shingrix (1 of 2) Never done   COVID-19 Vaccine (4 - Moderna series) 11/03/2020    Lab Results  Component Value Date   PSA1 1.3 06/30/2021   PSA1 0.5 06/13/2018   PSA1 0.5 11/30/2016   PSA 0.6 04/24/2014    Hypertension This is a chronic problem. The problem is uncontrolled. Pertinent negatives include no chest pain, headaches, palpitations or shortness of breath. Past treatments include angiotensin blockers, central alpha agonists and calcium channel blockers. The current treatment provides moderate improvement. There are no compliance problems.  Hypertensive end-organ damage includes kidney disease. There is no history of CAD/MI or CVA.  Hyperlipidemia This is a chronic problem. Recent lipid tests were reviewed and are high. Pertinent negatives include no chest pain, myalgias or shortness of breath. Current antihyperlipidemic treatment includes statins. The current treatment provides moderate improvement of lipids.    Lab Results  Component Value Date   NA 142 12/28/2021   K 3.9 12/28/2021   CO2 22 12/28/2021   GLUCOSE 104 (H) 12/28/2021   BUN 20 12/28/2021   CREATININE 1.53 (H) 12/28/2021   CALCIUM 9.4 12/28/2021   EGFR 46 (L) 12/28/2021   GFRNONAA 40 (L) 07/07/2020   Lab Results  Component Value  Date   CHOL 192 06/30/2021   HDL 26 (L) 06/30/2021   LDLCALC 106 (H) 06/30/2021   TRIG 349 (H) 06/30/2021   CHOLHDL 7.4 (H) 06/30/2021   Lab Results  Component Value Date   TSH 1.200 06/30/2021   Lab Results  Component Value Date   HGBA1C 6.4 (H) 12/28/2021   Lab Results  Component Value Date   WBC 4.8 06/30/2021   HGB 14.3 06/30/2021   HCT 45.0 06/30/2021   MCV 84 06/30/2021   PLT 259 06/30/2021   Lab Results  Component Value Date   ALT 15 06/30/2021   AST 20 06/30/2021   ALKPHOS 96 06/30/2021   BILITOT 0.4 06/30/2021   No results found for: "25OHVITD2", "25OHVITD3", "VD25OH"   Review of Systems  Constitutional:  Negative for appetite change, chills, diaphoresis, fatigue and unexpected weight change.  HENT:  Negative for hearing loss, tinnitus, trouble swallowing and voice change.   Eyes:  Negative for visual disturbance.  Respiratory:  Negative for choking, shortness of breath and wheezing.   Cardiovascular:  Negative for chest pain, palpitations and leg swelling.  Gastrointestinal:  Negative for abdominal pain, blood in stool, constipation and diarrhea.  Genitourinary:  Negative for difficulty urinating, dysuria and frequency.  Musculoskeletal:  Negative for arthralgias, back pain and myalgias.  Skin:  Negative for color change and rash.  Neurological:  Negative for dizziness, syncope and headaches.  Hematological:  Negative for adenopathy.  Psychiatric/Behavioral:  Negative for dysphoric mood and sleep disturbance. The patient is not nervous/anxious.     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/30/2016   Gout 04/11/2015   Dyslipidemia  04/11/2015   Essential (primary) hypertension 04/11/2015   Sarcoidosis 04/11/2015   Other allergic rhinitis 04/11/2015   Tobacco use disorder, moderate, in sustained remission 04/11/2015   Elevated PSA 04/11/2015    No Known Allergies  Past Surgical History:  Procedure Laterality Date   COLONOSCOPY  2009   normal   PROSTATE BIOPSY  2014   normal    Social History   Tobacco Use   Smoking status: Former    Packs/day: 0.50    Years: 30.00    Total pack years: 15.00    Types: Cigarettes    Quit date: 08/30/2012    Years since quitting: 9.8   Smokeless tobacco: Never  Vaping Use   Vaping Use: Never used  Substance Use Topics   Alcohol use: Yes    Alcohol/week: 0.0 standard drinks of alcohol    Comment: rarely   Drug use: No     Medication list has been reviewed and updated.  Current Meds  Medication Sig   albuterol (VENTOLIN HFA) 108 (90 Base) MCG/ACT inhaler INHALE 2 PUFFS BY MOUTH EVERY 6 HOURS AS NEEDED FOR WHEEZING FOR SHORTNESS OF BREATH   allopurinol (ZYLOPRIM) 100 MG tablet Take 2 tablets (200 mg total) by mouth daily.   amLODipine (NORVASC) 10 MG tablet Take 1 tablet (10 mg total) by mouth daily.   azelastine (ASTELIN) 0.1 % nasal spray Place 2 sprays into both nostrils 2 (two) times daily. Use in each nostril as directed   cloNIDine (CATAPRES) 0.1 MG tablet Take 1 tablet (0.1 mg total) by mouth 2 (two) times daily.   fluticasone (FLONASE) 50 MCG/ACT nasal spray Place 2 sprays into both nostrils daily.   gabapentin (NEURONTIN) 300 MG capsule Take 1 capsule (300 mg total) by mouth at bedtime.   irbesartan (AVAPRO) 300 MG tablet Take 1 tablet (300 mg total) by mouth daily.   rosuvastatin (CRESTOR) 40 MG tablet Take 1 tablet (40 mg total) by mouth daily.   spironolactone (ALDACTONE) 25 MG tablet Take 1 tablet (25 mg total) by mouth daily.   [DISCONTINUED] naproxen sodium (ALEVE) 220 MG tablet Take 220 mg by mouth daily as needed.       12/28/2021    9:53 AM  10/28/2021    9:42 AM 08/19/2021    8:43 AM 06/30/2021   10:10 AM  GAD 7 : Generalized Anxiety Score  Nervous, Anxious, on Edge 0 0 0 0  Control/stop worrying 0 0 0 0  Worry too much - different things 0 0 0 0  Trouble relaxing 0 0 0 0  Restless 0 0 0 0  Easily annoyed or irritable 0 0 0 0  Afraid - awful might happen 0 0 0 0  Total GAD 7 Score 0 0 0 0  Anxiety Difficulty Not difficult at all Not difficult at all Not difficult at all Not difficult at all       04/20/2022    8:22 AM 12/28/2021    9:53 AM 10/28/2021    9:41 AM  Depression screen PHQ 2/9  Decreased Interest 0 0 0  Down, Depressed, Hopeless 0 0 0  PHQ -  2 Score 0 0 0  Altered sleeping  0 0  Tired, decreased energy  0 0  Change in appetite  0 0  Feeling bad or failure about yourself   0 0  Trouble concentrating  0 0  Moving slowly or fidgety/restless  0 0  Suicidal thoughts  0 0  PHQ-9 Score  0 0  Difficult doing work/chores  Not difficult at all Not difficult at all    BP Readings from Last 3 Encounters:  06/30/22 (!) 158/78  12/28/21 134/84  10/28/21 138/82    Physical Exam Vitals and nursing note reviewed.  Constitutional:      Appearance: Normal appearance. He is well-developed.  HENT:     Head: Normocephalic.     Right Ear: Tympanic membrane, ear canal and external ear normal.     Left Ear: Tympanic membrane, ear canal and external ear normal.     Nose: Nose normal.  Eyes:     Conjunctiva/sclera: Conjunctivae normal.     Pupils: Pupils are equal, round, and reactive to light.  Neck:     Thyroid: No thyromegaly.     Vascular: No carotid bruit.  Cardiovascular:     Rate and Rhythm: Normal rate and regular rhythm.     Heart sounds: Normal heart sounds.  Pulmonary:     Effort: Pulmonary effort is normal.     Breath sounds: Normal breath sounds. No wheezing.  Chest:  Breasts:    Right: No mass.     Left: No mass.  Abdominal:     General: Bowel sounds are normal.     Palpations: Abdomen is soft.      Tenderness: There is no abdominal tenderness.  Musculoskeletal:        General: Normal range of motion.     Cervical back: Normal range of motion and neck supple.  Lymphadenopathy:     Cervical: No cervical adenopathy.  Skin:    General: Skin is warm and dry.  Neurological:     Mental Status: He is alert and oriented to person, place, and time.     Deep Tendon Reflexes: Reflexes are normal and symmetric.  Psychiatric:        Attention and Perception: Attention normal.        Mood and Affect: Mood normal.        Thought Content: Thought content normal.     Wt Readings from Last 3 Encounters:  06/30/22 194 lb (88 kg)  04/20/22 190 lb (86.2 kg)  12/28/21 199 lb 12.8 oz (90.6 kg)    BP (!) 158/78 (BP Location: Left Arm, Cuff Size: Large)   Pulse 81   Ht _0  (1.651 m)   Wt 194 lb (88 kg)   SpO2 98%   BMI 32.28 kg/m   Assessment and Plan: 1. Annual physical exam Exam is normal except for weight and BP. Encourage continued regular exercise and appropriate dietary changes.  2. Essential (primary) hypertension BP not controlled on amlodipine, irbesartan and clonidine Will add Spironolactone and recheck in 6 weeks. - CBC with Differential/Platelet - POCT urinalysis dipstick - spironolactone (ALDACTONE) 25 MG tablet; Take 1 tablet (25 mg total) by mouth daily.  Dispense: 90 tablet; Refill: 0  3. Aortic atherosclerosis ([facility]) On statin therapy - Lipid panel  4. Chronic kidney disease, stage 3a ([place]) Continue to monitor - has been essentially stable with GFR in the mid 40's - Comprehensive metabolic panel  5. Dyslipidemia - Lipid panel  6. Prediabetes Working on  diet and weight loss - Hemoglobin A1c  7. Prostate cancer screening DRE deferred - PSA   Partially dictated using Dragon software. Any errors are unintentional.  Halina Maidens, MD [facility]  06/30/2022

## 2022-07-01 LAB — CBC WITH DIFFERENTIAL/PLATELET
Basophils Absolute: 0.1 10*3/uL (ref 0.0–0.2)
Basos: 1 %
EOS (ABSOLUTE): 0.6 10*3/uL — ABNORMAL HIGH (ref 0.0–0.4)
Eos: 12 %
Hematocrit: 41.6 % (ref 37.5–51.0)
Hemoglobin: 13.6 g/dL (ref 13.0–17.7)
Immature Grans (Abs): 0 10*3/uL (ref 0.0–0.1)
Immature Granulocytes: 1 %
Lymphocytes Absolute: 1.6 10*3/uL (ref 0.7–3.1)
Lymphs: 30 %
MCH: 27.5 pg (ref 26.6–33.0)
MCHC: 32.7 g/dL (ref 31.5–35.7)
MCV: 84 fL (ref 79–97)
Monocytes Absolute: 0.6 10*3/uL (ref 0.1–0.9)
Monocytes: 12 %
Neutrophils Absolute: 2.3 10*3/uL (ref 1.4–7.0)
Neutrophils: 44 %
Platelets: 296 10*3/uL (ref 150–450)
RBC: 4.95 x10E6/uL (ref 4.14–5.80)
RDW: 14.5 % (ref 11.6–15.4)
WBC: 5.2 10*3/uL (ref 3.4–10.8)

## 2022-07-01 LAB — HEMOGLOBIN A1C
Est. average glucose Bld gHb Est-mCnc: 137 mg/dL
Hgb A1c MFr Bld: 6.4 % — ABNORMAL HIGH (ref 4.8–5.6)

## 2022-07-01 LAB — LIPID PANEL
Chol/HDL Ratio: 3.5 ratio (ref 0.0–5.0)
Cholesterol, Total: 111 mg/dL (ref 100–199)
HDL: 32 mg/dL — ABNORMAL LOW (ref >39)
LDL Chol Calc (NIH): 48 mg/dL (ref 0–99)
Triglycerides: 187 mg/dL — ABNORMAL HIGH (ref 0–149)
VLDL Cholesterol Cal: 31 mg/dL (ref 5–40)

## 2022-07-01 LAB — COMPREHENSIVE METABOLIC PANEL
ALT: 13 IU/L (ref 0–44)
AST: 17 IU/L (ref 0–40)
Albumin/Globulin Ratio: 1.3 (ref 1.2–2.2)
Albumin: 4.3 g/dL (ref 3.8–4.8)
Alkaline Phosphatase: 103 IU/L (ref 44–121)
BUN/Creatinine Ratio: 13 (ref 10–24)
BUN: 21 mg/dL (ref 8–27)
Bilirubin Total: 0.4 mg/dL (ref 0.0–1.2)
CO2: 24 mmol/L (ref 20–29)
Calcium: 9.9 mg/dL (ref 8.6–10.2)
Chloride: 105 mmol/L (ref 96–106)
Creatinine, Ser: 1.63 mg/dL — ABNORMAL HIGH (ref 0.76–1.27)
Globulin, Total: 3.3 g/dL (ref 1.5–4.5)
Glucose: 109 mg/dL — ABNORMAL HIGH (ref 70–99)
Potassium: 4 mmol/L (ref 3.5–5.2)
Sodium: 143 mmol/L (ref 134–144)
Total Protein: 7.6 g/dL (ref 6.0–8.5)
eGFR: 43 mL/min/{1.73_m2} — ABNORMAL LOW (ref >59)

## 2022-07-01 LAB — PSA: Prostate Specific Ag, Serum: 0.5 ng/mL (ref 0.0–4.0)

## 2022-07-04 ENCOUNTER — Other Ambulatory Visit: Payer: Self-pay | Admitting: Internal Medicine

## 2022-07-04 ENCOUNTER — Ambulatory Visit: Payer: Self-pay | Admitting: *Deleted

## 2022-07-04 ENCOUNTER — Encounter: Payer: Self-pay | Admitting: Internal Medicine

## 2022-07-04 DIAGNOSIS — M1000 Idiopathic gout, unspecified site: Secondary | ICD-10-CM

## 2022-07-04 MED ORDER — COLCHICINE 0.6 MG PO TABS: 0.6000 mg | ORAL_TABLET | Freq: Two times a day (BID) | ORAL | 0 refills | Status: DC | PRN

## 2022-07-04 NOTE — Telephone Encounter (Signed)
  Chief Complaint: Medication question Symptoms: Knee pain Frequency: since thursday Pertinent Negatives: Patient denies  Disposition: [] ED /[] Urgent Care (no appt availability in office) / [] Appointment(In office/virtual)/ []  [facility]/ [] Home Care/ [] Refused Recommended Disposition /[] [facility]/ [x]  Follow-up with PCP Additional Notes: Pt states that finally gout pain is resolving.  Pt would like to have some colchicine on hand for future gout flares. Pt was taken off of this medication and pt would like to know why.   Pt is also wondering if the Aldactone contributed to the gout flare up.   Please return pt's call.   Summary: Knee pain   patient was very upset because he asked to speak with the nurse he spoke with earlier regarding his knee pain (see lab results). Caller stated medication recommended is not working, offered appointment and patient yelled "I'm in pain forget it" . Please follow up with patient regarding his concerns.      Reason for Disposition  Prescription request for new medicine (not a refill)  Answer Assessment - Initial Assessment Questions 1. NAME of MEDICINE: "What medicine(s) are you calling about?"     Colchicine and aldactone 2. QUESTION: "What is your question?" (e.g., double dose of medicine, side effect)     PT would like colchicine available for gout flares. Pt is wondering if aldactone may have contributed to the gout flare 3. PRESCRIBER: "Who prescribed the medicine?" Reason: if prescribed by specialist, call should be referred to that group.      4. SYMPTOMS: "Do you have any symptoms?" If Yes, ask: "What symptoms are you having?"  "How bad are the symptoms (e.g., mild, moderate, severe)      5. PREGNANCY:  "Is there any chance that you are pregnant?" "When was your last menstrual period?"  Protocols used: Medication Question Call-A-AH

## 2022-07-04 NOTE — Telephone Encounter (Unsigned)
Copied from [place] [id number]. Topic: General - Inquiry >> Jul 04, 2022  3:11 PM Tiffany B wrote: Reason for CRM: patient was very upset because he asked to speak with the nurse he spoke with earlier regarding his knee pain (see lab results). Caller stated medication recommended is not working, offered appointment and patient yelled "I'm in pain forget it" . Please follow up with patient regarding his concerns. >> Jul 04, 2022  3:22 PM Tiffany B wrote: Patient will be triage by [facility] Nurse.

## 2022-07-04 NOTE — Telephone Encounter (Signed)
Summary: Knee pain   patient was very upset because he asked to speak with the nurse he spoke with earlier regarding his knee pain (see lab results). Caller stated medication recommended is not working, offered appointment and patient yelled "I'm in pain forget it" . Please follow up with patient regarding his concerns.             Called patient to review sx of knee pain. No answer, LVMTCB [phone].

## 2022-08-12 ENCOUNTER — Ambulatory Visit: Payer: Medicare HMO | Admitting: Internal Medicine

## 2022-08-15 ENCOUNTER — Other Ambulatory Visit: Payer: Self-pay | Admitting: Internal Medicine

## 2022-08-15 DIAGNOSIS — J3089 Other allergic rhinitis: Secondary | ICD-10-CM

## 2022-11-08 ENCOUNTER — Other Ambulatory Visit: Payer: Self-pay | Admitting: Internal Medicine

## 2022-11-08 DIAGNOSIS — M1000 Idiopathic gout, unspecified site: Secondary | ICD-10-CM

## 2022-11-09 NOTE — Telephone Encounter (Signed)
Left voice mail to set up Bp ck follow up.

## 2023-04-26 ENCOUNTER — Ambulatory Visit (INDEPENDENT_AMBULATORY_CARE_PROVIDER_SITE_OTHER): Payer: Medicare HMO

## 2023-04-26 VITALS — BP 132/80 | Ht 65.0 in | Wt 191.6 lb

## 2023-04-26 DIAGNOSIS — Z0000 Encounter for general adult medical examination without abnormal findings: Secondary | ICD-10-CM

## 2023-04-26 NOTE — Patient Instructions (Addendum)
Edward Rogers , Thank you for taking time to come for your Medicare Wellness Visit. I appreciate your ongoing commitment to your health goals. Please review the following plan we discussed and let me know if I can assist you in the future.   Referrals/Orders/Follow-Ups/Clinician Recommendations: none  This is a list of the screening recommended for you and due dates:  Health Maintenance  Topic Date Due   DTaP/Tdap/Td vaccine (1 - Tdap) Never done   Zoster (Shingles) Vaccine (1 of 2) 12/28/1993   COVID-19 Vaccine (5 - 2023-24 season) 10/29/2022   Flu Shot  03/30/2023   Medicare Annual Wellness Visit  04/25/2024   Pneumonia Vaccine  Completed   Hepatitis C Screening  Completed   HPV Vaccine  Aged Out   Colon Cancer Screening  Discontinued    Advanced directives: (ACP Link)Information on Advanced Care Planning can be found at [facility] Advance Health Care Directives Advance Health Care Directives ([email or web address]   Next Medicare Annual Wellness Visit scheduled for next year: Yes    05/01/24 @ 8:15 am in person

## 2023-04-26 NOTE — Progress Notes (Signed)
Subjective:   Edward Rogers is a 77 y.o. male who presents for Medicare Annual/Subsequent preventive examination.  Visit Complete: In person   Review of Systems     Cardiac Risk Factors include: advanced age (>89men, >77 women);obesity (BMI >30kg/m2);dyslipidemia;hypertension;male gender;sedentary lifestyle     Objective:    Today's Vitals   04/26/23 0809 04/26/23 0810  BP: 132/80   Weight: 191 lb 9.6 oz (86.9 kg)   Height: 5\' 5"  (1.651 m)   PainSc:  4    Body mass index is 31.88 kg/m.     04/26/2023    8:16 AM 04/20/2022    8:24 AM 03/08/2021   10:30 AM 11/30/2016   10:12 AM 11/04/2015    8:41 AM  Advanced Directives  Does Patient Have a Medical Advance Directive? No Yes No No No  Type of Special educational needs teacher of [place];Living will     Copy of Healthcare Power of Attorney in Chart?  No - copy requested     Would patient like information on creating a medical advance directive? No - Patient declined  No - Patient declined  No - patient declined information    Current Medications (verified) Outpatient Encounter Medications as of 04/26/2023  Medication Sig   albuterol (VENTOLIN HFA) 108 (90 Base) MCG/ACT inhaler INHALE 2 PUFFS BY MOUTH EVERY 6 HOURS AS NEEDED FOR WHEEZING FOR SHORTNESS OF BREATH   allopurinol (ZYLOPRIM) 100 MG tablet Take 2 tablets by mouth once daily   amLODipine (NORVASC) 10 MG tablet Take 1 tablet (10 mg total) by mouth daily.   Azelastine HCl 137 MCG/SPRAY SOLN USE 2 SPRAY(S) IN EACH NOSTRIL TWICE DAILY AS DIRECTED   cloNIDine (CATAPRES) 0.1 MG tablet Take 1 tablet (0.1 mg total) by mouth 2 (two) times daily.   colchicine 0.6 MG tablet Take 1 tablet (0.6 mg total) by mouth 2 (two) times daily as needed.   fluticasone (FLONASE) 50 MCG/ACT nasal spray Use 2 spray(s) in each nostril once daily   gabapentin (NEURONTIN) 300 MG capsule Take 1 capsule (300 mg total) by mouth at bedtime.   irbesartan (AVAPRO) 300 MG tablet Take 1 tablet  (300 mg total) by mouth daily.   rosuvastatin (CRESTOR) 40 MG tablet Take 1 tablet (40 mg total) by mouth daily.   spironolactone (ALDACTONE) 25 MG tablet Take 1 tablet (25 mg total) by mouth daily.   No facility-administered encounter medications on file as of 04/26/2023.    Allergies (verified) Patient has no known allergies.   History: Past Medical History:  Diagnosis Date   Hypertension    Past Surgical History:  Procedure Laterality Date   COLONOSCOPY  2009   normal   PROSTATE BIOPSY  2014   normal   Family History  Problem Relation Age of Onset   Cancer Mother    Heart failure Father    Social History   Socioeconomic History   Marital status: Single    Spouse name: Not on file   Number of children: 3   Years of education: Not on file   Highest education level: High school graduate  Occupational History   Not on file  Tobacco Use   Smoking status: Former    Current packs/day: 0.00    Average packs/day: 0.5 packs/day for 30.0 years (15.0 ttl pk-yrs)    Types: Cigarettes    Start date: 08/30/1982    Quit date: 08/30/2012    Years since quitting: 10.6   Smokeless tobacco: Never  Vaping Use  Vaping status: Never Used  Substance and Sexual Activity   Alcohol use: Yes    Alcohol/week: 0.0 standard drinks of alcohol    Comment: rarely   Drug use: No   Sexual activity: Not Currently  Other Topics Concern   Not on file  Social History Narrative   Pt lives alone   Social Determinants of Health   Financial Resource Strain: Low Risk  (04/26/2023)   Overall Financial Resource Strain (CARDIA)    Difficulty of Paying Living Expenses: Not very hard  Food Insecurity: No Food Insecurity (04/26/2023)   Hunger Vital Sign    Worried About Running Out of Food in the Last Year: Never true    Ran Out of Food in the Last Year: Never true  Transportation Needs: No Transportation Needs (04/26/2023)   PRAPARE - Administrator, Civil Service (Medical): No    Lack of  Transportation (Non-Medical): No  Physical Activity: Insufficiently Active (04/26/2023)   Exercise Vital Sign    Days of Exercise per Week: 2 days    Minutes of Exercise per Session: 20 min  Stress: No Stress Concern Present (04/26/2023)   [employer] of Occupational Health - Occupational Stress Questionnaire    Feeling of Stress : Not at all  Social Connections: Moderately Isolated (04/26/2023)   Social Connection and Isolation Panel [NHANES]    Frequency of Communication with Friends and Family: More than three times a week    Frequency of Social Gatherings with Friends and Family: Once a week    Attends Religious Services: More than 4 times per year    Active Member of [employer] or Organizations: No    Attends Engineer, structural: Never    Marital Status: Divorced    Tobacco Counseling Counseling given: Not Answered   Clinical Intake:  Pre-visit preparation completed: Yes  Pain : 0-10 Pain Score: 4  Pain Type: Acute pain Pain Location: Shoulder Pain Orientation: Left, Right Pain Descriptors / Indicators: Aching Pain Onset: More than a month ago Pain Frequency: Occasional Pain Relieving Factors: naproxen, gabepentin  Pain Relieving Factors: naproxen, gabepentin  Nutritional Risks: None Diabetes: No  How often do you need to have someone help you when you read instructions, pamphlets, or other written materials from your doctor or pharmacy?: 1 - Never  Interpreter Needed?: No  Information entered by :: Kennedy Bucker, LPN   Activities of Daily Living    04/26/2023    8:16 AM 04/22/2023    9:44 AM  In your present state of health, do you have any difficulty performing the following activities:  Hearing? 0 0  Vision? 0 0  Difficulty concentrating or making decisions? 0 0  Walking or climbing stairs? 1 0  Comment hips   Dressing or bathing? 0 0  Doing errands, shopping? 0 0  Preparing Food and eating ? N N  Using the Toilet? N N  In the past six  months, have you accidently leaked urine? N N  Do you have problems with loss of bowel control? N N  Managing your Medications? N N  Managing your Finances? N N  Housekeeping or managing your Housekeeping? N N    Patient Care Team: Reubin Milan, MD as PCP - General (Internal Medicine) Harle Battiest, PA-C as Physician Assistant (Urology)  Indicate any recent Medical Services you may have received from other than [facility] providers in the past year (date may be approximate).     Assessment:   This is  a routine wellness examination for Edward Rogers.  Hearing/Vision screen Hearing Screening - Comments:: No aids Vision Screening - Comments:: Wears glasses- Dr.Shade  Dietary issues and exercise activities discussed:     Goals Addressed             This Visit's Progress    DIET - EAT MORE FRUITS AND VEGETABLES         Depression Screen    04/26/2023    8:14 AM 04/20/2022    8:22 AM 12/28/2021    9:53 AM 10/28/2021    9:41 AM 08/19/2021    8:43 AM 06/30/2021   10:10 AM 06/30/2021   10:07 AM  PHQ 2/9 Scores  PHQ - 2 Score 0 0 0 0 0 0 0  PHQ- 9 Score 0  0 0 0 0 0    Fall Risk    04/26/2023    8:16 AM 04/22/2023    9:44 AM 04/20/2022    8:24 AM 12/28/2021    9:53 AM 10/28/2021    9:42 AM  Fall Risk   Falls in the past year? 0 0 0 0 0  Number falls in past yr: 0  0 0 0  Injury with Fall? 0  0 0 0  Risk for fall due to : No Fall Risks  No Fall Risks No Fall Risks No Fall Risks  Follow up Falls prevention discussed;Falls evaluation completed   Falls evaluation completed Falls evaluation completed    MEDICARE RISK AT HOME: Medicare Risk at Home Any stairs in or around the home?: Yes If so, are there any without handrails?: No Home free of loose throw rugs in walkways, pet beds, electrical cords, etc?: Yes Adequate lighting in your home to reduce risk of falls?: Yes Life alert?: No Use of a cane, walker or w/c?: No Grab bars in the bathroom?: No Shower chair or bench in  shower?: No Elevated toilet seat or a handicapped toilet?: No  TIMED UP AND GO:  Was the test performed?  Yes  Length of time to ambulate 10 feet: 4 sec Gait steady and fast without use of assistive device    Cognitive Function:        04/26/2023    8:18 AM 04/20/2022    8:25 AM 06/13/2018   10:26 AM 11/30/2016   10:14 AM  6CIT Screen  What Year? 0 points 0 points 0 points 0 points  What month? 0 points 0 points 0 points 0 points  What time? 0 points 0 points 0 points 0 points  Count back from 20 2 points 0 points 0 points 0 points  Months in reverse 4 points 0 points 0 points 0 points  Repeat phrase 0 points 0 points 2 points 2 points  Total Score 6 points 0 points 2 points 2 points    Immunizations Immunization History  Administered Date(s) Administered   COVID-19, mRNA, vaccine(Comirnaty)12 years and older 06/30/2022   Fluad Quad(high Dose 65+) 06/30/2021   Moderna Sars-Covid-2 Vaccination 11/23/2019, 12/26/2019, 09/08/2020   Pneumococcal Conjugate-13 11/04/2015   Pneumococcal Polysaccharide-23 08/31/2007, 06/13/2018   Zoster, Live 02/11/2008    TDAP status: Due, Education has been provided regarding the importance of this vaccine. Advised may receive this vaccine at local pharmacy or Health Dept. Aware to provide a copy of the vaccination record if obtained from local pharmacy or Health Dept. Verbalized acceptance and understanding.  Flu Vaccine status: Declined, Education has been provided regarding the importance of this vaccine but patient still declined. Advised  may receive this vaccine at local pharmacy or Health Dept. Aware to provide a copy of the vaccination record if obtained from local pharmacy or Health Dept. Verbalized acceptance and understanding.  Pneumococcal vaccine status: Up to date  Covid-19 vaccine status: Completed vaccines  Qualifies for Shingles Vaccine? Yes   Zostavax completed Yes   Shingrix Completed?: No.    Education has been provided  regarding the importance of this vaccine. Patient has been advised to call insurance company to determine out of pocket expense if they have not yet received this vaccine. Advised may also receive vaccine at local pharmacy or Health Dept. Verbalized acceptance and understanding.  Screening Tests Health Maintenance  Topic Date Due   DTaP/Tdap/Td (1 - Tdap) Never done   Zoster Vaccines- Shingrix (1 of 2) 12/28/1993   COVID-19 Vaccine (5 - 2023-24 season) 10/29/2022   INFLUENZA VACCINE  03/30/2023   Medicare Annual Wellness (AWV)  04/25/2024   Pneumonia Vaccine 71+ Years old  Completed   Hepatitis C Screening  Completed   HPV VACCINES  Aged Out   Colonoscopy  Discontinued    Health Maintenance  Health Maintenance Due  Topic Date Due   DTaP/Tdap/Td (1 - Tdap) Never done   Zoster Vaccines- Shingrix (1 of 2) 12/28/1993   COVID-19 Vaccine (5 - 2023-24 season) 10/29/2022   INFLUENZA VACCINE  03/30/2023    Colorectal cancer screening: No longer required.   Lung Cancer Screening: (Low Dose CT Chest recommended if Age 56-80 years, 20 pack-year currently smoking OR have quit w/in 15years.) does not qualify.   Additional Screening:  Hepatitis C Screening: does qualify; Completed 06/13/18  Vision Screening: Recommended annual ophthalmology exams for early detection of glaucoma and other disorders of the eye. Is the patient up to date with their annual eye exam?  Yes  Who is the provider or what is the name of the office in which the patient attends annual eye exams? Dr.Shade If pt is not established with a provider, would they like to be referred to a provider to establish care? No .   Dental Screening: Recommended annual dental exams for proper oral hygiene   Community Resource Referral / Chronic Care Management: CRR required this visit?  No   CCM required this visit?  No     Plan:     I have personally reviewed and noted the following in the patient's chart:   Medical and  social history Use of alcohol, tobacco or illicit drugs  Current medications and supplements including opioid prescriptions. Patient is not currently taking opioid prescriptions. Functional ability and status Nutritional status Physical activity Advanced directives List of other physicians Hospitalizations, surgeries, and ER visits in previous 12 months Vitals Screenings to include cognitive, depression, and falls Referrals and appointments  In addition, I have reviewed and discussed with patient certain preventive protocols, quality metrics, and best practice recommendations. A written personalized care plan for preventive services as well as general preventive health recommendations were provided to patient.     Hal Hope, LPN   1/61/0960   After Visit Summary: (MyChart) Due to this being a telephonic visit, the after visit summary with patients personalized plan was offered to patient via MyChart   Nurse Notes: none

## 2023-05-15 ENCOUNTER — Other Ambulatory Visit: Payer: Self-pay | Admitting: Internal Medicine

## 2023-05-15 DIAGNOSIS — I10 Essential (primary) hypertension: Secondary | ICD-10-CM

## 2023-05-15 DIAGNOSIS — M1000 Idiopathic gout, unspecified site: Secondary | ICD-10-CM

## 2023-05-15 DIAGNOSIS — E785 Hyperlipidemia, unspecified: Secondary | ICD-10-CM

## 2023-05-16 ENCOUNTER — Other Ambulatory Visit: Payer: Self-pay | Admitting: Internal Medicine

## 2023-05-16 DIAGNOSIS — J3089 Other allergic rhinitis: Secondary | ICD-10-CM

## 2023-06-09 ENCOUNTER — Encounter: Payer: Self-pay | Admitting: Internal Medicine

## 2023-06-09 ENCOUNTER — Ambulatory Visit (INDEPENDENT_AMBULATORY_CARE_PROVIDER_SITE_OTHER): Payer: Medicare HMO | Admitting: Internal Medicine

## 2023-06-09 VITALS — BP 122/74 | HR 94 | Temp 98.2°F | Ht 65.0 in | Wt 191.0 lb

## 2023-06-09 DIAGNOSIS — J0100 Acute maxillary sinusitis, unspecified: Secondary | ICD-10-CM

## 2023-06-09 MED ORDER — AMOXICILLIN-POT CLAVULANATE 875-125 MG PO TABS
1.0000 | ORAL_TABLET | Freq: Two times a day (BID) | ORAL | 0 refills | Status: AC
Start: 2023-06-09 — End: 2023-06-19

## 2023-06-09 NOTE — Progress Notes (Signed)
Date:  06/09/2023   Name:  Edward Rogers   DOB:  Nov 30, 1945   MRN:  [id number]   Chief Complaint: Sinusitis  Sinusitis This is a new problem. There has been no fever. The pain is mild. Associated symptoms include congestion, shortness of breath and sinus pressure. Pertinent negatives include no chills or headaches. Past treatments include acetaminophen and nasal decongestants. The treatment provided mild relief.    Review of Systems  Constitutional:  Negative for chills, fatigue and fever.  HENT:  Positive for congestion, postnasal drip and sinus pressure.   Respiratory:  Positive for shortness of breath. Negative for chest tightness and wheezing.   Neurological:  Negative for dizziness, light-headedness and headaches.  Psychiatric/Behavioral:  Positive for sleep disturbance. Negative for dysphoric mood. The patient is not nervous/anxious.      Lab Results  Component Value Date   NA 143 06/30/2022   K 4.0 06/30/2022   CO2 24 06/30/2022   GLUCOSE 109 (H) 06/30/2022   BUN 21 06/30/2022   CREATININE 1.63 (H) 06/30/2022   CALCIUM 9.9 06/30/2022   EGFR 43 (L) 06/30/2022   GFRNONAA 40 (L) 07/07/2020   Lab Results  Component Value Date   CHOL 111 06/30/2022   HDL 32 (L) 06/30/2022   LDLCALC 48 06/30/2022   TRIG 187 (H) 06/30/2022   CHOLHDL 3.5 06/30/2022   Lab Results  Component Value Date   TSH 1.200 06/30/2021   Lab Results  Component Value Date   HGBA1C 6.4 (H) 06/30/2022   Lab Results  Component Value Date   WBC 5.2 06/30/2022   HGB 13.6 06/30/2022   HCT 41.6 06/30/2022   MCV 84 06/30/2022   PLT 296 06/30/2022   Lab Results  Component Value Date   ALT 13 06/30/2022   AST 17 06/30/2022   ALKPHOS 103 06/30/2022   BILITOT 0.4 06/30/2022   No results found for: "25OHVITD2", "25OHVITD3", "VD25OH"   Patient Active Problem List   Diagnosis Date Noted   Prediabetes 07/01/2021   Lumbar radiculopathy 07/17/2020   Aortic atherosclerosis (HCC) 07/06/2020    Chronic kidney disease, stage 3a (HCC) 07/06/2020   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5 packs/day for 30.0 years (15.0 ttl pk-yrs)    Types: Cigarettes    Start date: 08/30/1982    Quit date: 08/30/2012    Years since quitting: 10.7   Smokeless tobacco: Never  Vaping Use   Vaping status: Never Used  Substance Use Topics   Alcohol use: Yes    Alcohol/week: 0.0 standard drinks of alcohol    Comment: rarely   Drug use: No     Medication list has been reviewed and updated.  Current Meds  Medication Sig   albuterol (VENTOLIN HFA) 108 (90 Base) MCG/ACT inhaler INHALE 2 PUFFS BY MOUTH EVERY 6 HOURS AS NEEDED  FOR WHEEZING FOR SHORTNESS OF BREATH   allopurinol (ZYLOPRIM) 100 MG tablet Take 2 tablets by mouth once daily   amLODipine (NORVASC) 10 MG tablet Take 1 tablet by mouth once daily   amoxicillin-clavulanate (AUGMENTIN) 875-125 MG tablet Take 1 tablet by mouth 2 (two) times daily for 10 days.   Azelastine HCl 137 MCG/SPRAY SOLN USE 2 SPRAY(S) IN EACH NOSTRIL TWICE DAILY AS DIRECTED   cloNIDine (CATAPRES) 0.1 MG tablet Take 1 tablet (0.1 mg total) by mouth 2 (two) times daily.   colchicine 0.6 MG tablet Take 1 tablet (0.6 mg total) by mouth 2 (two) times daily as needed.   fluticasone (FLONASE) 50 MCG/ACT nasal spray Use 2 spray(s) in each nostril once daily    gabapentin (NEURONTIN) 300 MG capsule Take 1 capsule (300 mg total) by mouth at bedtime.   irbesartan (AVAPRO) 300 MG tablet Take 1 tablet by mouth once daily   rosuvastatin (CRESTOR) 40 MG tablet Take 1 tablet by mouth once daily   spironolactone (ALDACTONE) 25 MG tablet Take 1 tablet (25 mg total) by mouth daily.       06/09/2023    4:25 PM 12/28/2021    9:53 AM 10/28/2021    9:42 AM 08/19/2021    8:43 AM  GAD 7 : Generalized Anxiety Score  Nervous, Anxious, on Edge 0 0 0 0  Control/stop worrying 0 0 0 0  Worry too much - different things 0 0 0 0  Trouble relaxing 0 0 0 0  Restless 0 0 0 0  Easily annoyed or irritable 0 0 0 0  Afraid - awful might happen 0 0 0 0  Total GAD 7 Score 0 0 0 0  Anxiety Difficulty Not difficult at all Not difficult at all Not difficult at all Not difficult at all       06/09/2023    4:25 PM 04/26/2023    8:14 AM 04/20/2022    8:22 AM  Depression screen PHQ 2/9  Decreased Interest 0 0 0  Down, Depressed, Hopeless 0 0 0  PHQ - 2 Score 0 0 0  Altered sleeping 0 0   Tired, decreased energy 0 0   Change in appetite 0 0   Feeling bad or failure about yourself  0 0   Trouble concentrating 0 0   Moving slowly or fidgety/restless 0 0   Suicidal thoughts 0 0   PHQ-9 Score 0 0   Difficult doing work/chores Not difficult at all Not difficult at all     BP Readings from Last 3 Encounters:  06/09/23 122/74  04/26/23 132/80  06/30/22 (!) 158/78    Physical Exam Vitals and nursing note reviewed.  Constitutional:      General: He is not in acute distress.    Appearance: He is well-developed.  HENT:     Head: Normocephalic and atraumatic.     Right Ear: Ear canal and external ear normal. Tympanic membrane is not erythematous or retracted.     Left Ear: Ear canal and external ear normal. Tympanic membrane is not erythematous or retracted.     Nose:     Right Sinus: Maxillary sinus tenderness present. No frontal sinus tenderness.     Left Sinus:  Maxillary sinus tenderness present. No frontal sinus tenderness.  Cardiovascular:     Rate and Rhythm: Normal rate and regular rhythm.     Heart sounds: Normal heart sounds.  Pulmonary:     Effort: Pulmonary effort is normal. No respiratory distress.  Breath sounds: Normal breath sounds. No wheezing or rales.  Lymphadenopathy:     Cervical: No cervical adenopathy.  Skin:    General: Skin is warm and dry.     Findings: No rash.  Neurological:     Mental Status: He is alert and oriented to person, place, and time.  Psychiatric:        Mood and Affect: Mood normal.        Behavior: Behavior normal.     Wt Readings from Last 3 Encounters:  06/09/23 191 lb (86.6 kg)  04/26/23 191 lb 9.6 oz (86.9 kg)  06/30/22 194 lb (88 kg)    BP 122/74 (BP Location: Right Arm, Cuff Size: Large)   Pulse 94   Temp 98.2 F (36.8 C) (Oral)   Ht 5\' 5"  (1.651 m)   Wt 191 lb (86.6 kg)   SpO2 98%   BMI 31.78 kg/m   Assessment and Plan:  Problem List Items Addressed This Visit   None Visit Diagnoses     Acute non-recurrent maxillary sinusitis    -  Primary   continue Theraflu and Flonase NS   Relevant Medications   amoxicillin-clavulanate (AUGMENTIN) 875-125 MG tablet       No follow-ups on file.    Reubin Milan, MD [facility] [place]

## 2023-06-13 ENCOUNTER — Other Ambulatory Visit: Payer: Self-pay | Admitting: Internal Medicine

## 2023-06-13 DIAGNOSIS — I10 Essential (primary) hypertension: Secondary | ICD-10-CM

## 2023-06-13 DIAGNOSIS — J3089 Other allergic rhinitis: Secondary | ICD-10-CM

## 2023-07-04 ENCOUNTER — Encounter: Payer: Self-pay | Admitting: Internal Medicine

## 2023-07-04 ENCOUNTER — Ambulatory Visit (INDEPENDENT_AMBULATORY_CARE_PROVIDER_SITE_OTHER): Payer: Medicare HMO | Admitting: Internal Medicine

## 2023-07-04 VITALS — BP 134/76 | HR 81 | Ht 65.0 in | Wt 190.2 lb

## 2023-07-04 DIAGNOSIS — E785 Hyperlipidemia, unspecified: Secondary | ICD-10-CM

## 2023-07-04 DIAGNOSIS — N1831 Chronic kidney disease, stage 3a: Secondary | ICD-10-CM | POA: Diagnosis not present

## 2023-07-04 DIAGNOSIS — R7303 Prediabetes: Secondary | ICD-10-CM | POA: Diagnosis not present

## 2023-07-04 DIAGNOSIS — Z23 Encounter for immunization: Secondary | ICD-10-CM

## 2023-07-04 DIAGNOSIS — Z0000 Encounter for general adult medical examination without abnormal findings: Secondary | ICD-10-CM | POA: Diagnosis not present

## 2023-07-04 DIAGNOSIS — J329 Chronic sinusitis, unspecified: Secondary | ICD-10-CM | POA: Diagnosis not present

## 2023-07-04 DIAGNOSIS — I700 Atherosclerosis of aorta: Secondary | ICD-10-CM

## 2023-07-04 DIAGNOSIS — M1000 Idiopathic gout, unspecified site: Secondary | ICD-10-CM

## 2023-07-04 DIAGNOSIS — I10 Essential (primary) hypertension: Secondary | ICD-10-CM | POA: Diagnosis not present

## 2023-07-04 NOTE — Assessment & Plan Note (Signed)
Continue claritin and Flonase Use Neti pot as needed Consider ENT evaluation

## 2023-07-04 NOTE — Progress Notes (Signed)
Date:  07/04/2023   Name:  Edward Rogers   DOB:  [birth year]   MRN:  [id number]   Chief Complaint: Annual Exam Edward Rogers is a 77 y.o. male who presents today for his Complete Annual Exam. He feels well. He reports exercising - some. He reports he is sleeping well. He is staying busy with lawn maintenance.  Colonoscopy: 2010 - aged out  Immunization History  Administered Date(s) Administered   Fluad Quad(high Dose 65+) 06/30/2021   Moderna Sars-Covid-2 Vaccination 11/23/2019, 12/26/2019, 09/08/2020   Pfizer(Comirnaty)Fall Seasonal Vaccine 12 years and older 06/30/2022, 07/04/2023   Pneumococcal Conjugate-13 11/04/2015   Pneumococcal Polysaccharide-23 08/31/2007, 06/13/2018   Zoster, Live 02/11/2008   Health Maintenance Due  Topic Date Due   DTaP/Tdap/Td (1 - Tdap) Never done   Zoster Vaccines- Shingrix (1 of 2) 12/28/1993    Lab Results  Component Value Date   PSA1 0.5 06/30/2022   PSA1 1.3 06/30/2021   PSA1 0.5 06/13/2018   PSA 0.6 04/24/2014    Hypertension This is a chronic problem. The problem is controlled. Pertinent negatives include no chest pain, headaches, palpitations or shortness of breath. Past treatments include diuretics, angiotensin blockers, calcium channel blockers and central alpha agonists. The current treatment provides significant improvement.  Hyperlipidemia This is a chronic problem. The problem is controlled. Pertinent negatives include no chest pain or shortness of breath. Current antihyperlipidemic treatment includes statins.  Diabetes He presents for his follow-up diabetic visit. Diabetes type: prediabetes. Pertinent negatives for hypoglycemia include no dizziness, headaches or nervousness/anxiousness. Pertinent negatives for diabetes include no chest pain, no fatigue and no weakness. Current diabetic treatment includes diet.    Review of Systems  Constitutional:  Negative for fatigue and unexpected weight change.  HENT:  Positive  for congestion and sinus pressure. Negative for nosebleeds, sore throat and trouble swallowing.   Eyes:  Negative for visual disturbance.  Respiratory:  Negative for cough, chest tightness, shortness of breath and wheezing.   Cardiovascular:  Negative for chest pain, palpitations and leg swelling.  Gastrointestinal:  Negative for abdominal pain, constipation and diarrhea.  Genitourinary:  Negative for difficulty urinating.  Musculoskeletal:  Positive for back pain. Negative for arthralgias.  Neurological:  Negative for dizziness, weakness, light-headedness and headaches.  Psychiatric/Behavioral:  Negative for dysphoric mood and sleep disturbance. The patient is not nervous/anxious.      Lab Results  Component Value Date   NA 143 06/30/2022   K 4.0 06/30/2022   CO2 24 06/30/2022   GLUCOSE 109 (H) 06/30/2022   BUN 21 06/30/2022   CREATININE 1.63 (H) 06/30/2022   CALCIUM 9.9 06/30/2022   EGFR 43 (L) 06/30/2022   GFRNONAA 40 (L) 07/07/2020   Lab Results  Component Value Date   CHOL 111 06/30/2022   HDL 32 (L) 06/30/2022   LDLCALC 48 06/30/2022   TRIG 187 (H) 06/30/2022   CHOLHDL 3.5 06/30/2022   Lab Results  Component Value Date   TSH 1.200 06/30/2021   Lab Results  Component Value Date   HGBA1C 6.4 (H) 06/30/2022   Lab Results  Component Value Date   WBC 5.2 06/30/2022   HGB 13.6 06/30/2022   HCT 41.6 06/30/2022   MCV 84 06/30/2022   PLT 296 06/30/2022   Lab Results  Component Value Date   ALT 13 06/30/2022   AST 17 06/30/2022   ALKPHOS 103 06/30/2022   BILITOT 0.4 06/30/2022   No results found for: "25OHVITD2", "25OHVITD3", "VD25OH"   Patient Active  Problem List   Diagnosis Date Noted   Chronic congestion of paranasal sinus 07/04/2023   Prediabetes 07/01/2021   Lumbar radiculopathy 07/17/2020   Aortic atherosclerosis (HCC) 07/06/2020   Chronic kidney disease, stage 3a (HCC) 07/06/2020   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   Arthritis of  joint of toe 06/01/2017   Chronic left-sided low back pain with left-sided sciatica 11/30/2016   Gout 04/11/2015   Dyslipidemia 04/11/2015   Essential (primary) hypertension 04/11/2015   Sarcoidosis 04/11/2015   Other allergic rhinitis 04/11/2015   Tobacco use disorder, moderate, in sustained remission 04/11/2015   Elevated PSA 04/11/2015    No Known Allergies  Past Surgical History:  Procedure Laterality Date   COLONOSCOPY  2009   normal   PROSTATE BIOPSY  2014   normal    Social History   Tobacco Use   Smoking status: Former    Current packs/day: 0.00    Average packs/day: 0.5 packs/day for 30.0 years (15.0 ttl pk-yrs)    Types: Cigarettes    Start date: 08/30/1982    Quit date: 08/30/2012    Years since quitting: 10.8   Smokeless tobacco: Never  Vaping Use   Vaping status: Never Used  Substance Use Topics   Alcohol use: Yes    Alcohol/week: 0.0 standard drinks of alcohol    Comment: rarely   Drug use: No     Medication list has been reviewed and updated.  Current Meds  Medication Sig   albuterol (VENTOLIN HFA) 108 (90 Base) MCG/ACT inhaler INHALE 2 PUFFS BY MOUTH EVERY 6 HOURS AS NEEDED FOR WHEEZING FOR SHORTNESS OF BREATH   allopurinol (ZYLOPRIM) 100 MG tablet Take 2 tablets by mouth once daily   amLODipine (NORVASC) 10 MG tablet Take 1 tablet by mouth once daily   Azelastine HCl 137 MCG/SPRAY SOLN USE 2 SPRAY(S) IN EACH NOSTRIL TWICE DAILY AS DIRECTED   cloNIDine (CATAPRES) 0.1 MG tablet Take 1 tablet by mouth twice daily   colchicine 0.6 MG tablet Take 1 tablet (0.6 mg total) by mouth 2 (two) times daily as needed.   fluticasone (FLONASE) 50 MCG/ACT nasal spray Use 2 spray(s) in each nostril once daily   gabapentin (NEURONTIN) 300 MG capsule Take 1 capsule (300 mg total) by mouth at bedtime.   irbesartan (AVAPRO) 300 MG tablet Take 1 tablet by mouth once daily   rosuvastatin (CRESTOR) 40 MG tablet Take 1 tablet by mouth once daily   spironolactone (ALDACTONE)  25 MG tablet Take 1 tablet (25 mg total) by mouth daily.       07/04/2023    8:28 AM 06/09/2023    4:25 PM 12/28/2021    9:53 AM 10/28/2021    9:42 AM  GAD 7 : Generalized Anxiety Score  Nervous, Anxious, on Edge 0 0 0 0  Control/stop worrying 0 0 0 0  Worry too much - different things 0 0 0 0  Trouble relaxing 0 0 0 0  Restless 0 0 0 0  Easily annoyed or irritable 0 0 0 0  Afraid - awful might happen 0 0 0 0  Total GAD 7 Score 0 0 0 0  Anxiety Difficulty Not difficult at all Not difficult at all Not difficult at all Not difficult at all       07/04/2023    8:27 AM 06/09/2023    4:25 PM 04/26/2023    8:14 AM  Depression screen PHQ 2/9  Decreased Interest 0 0 0  Down, Depressed, Hopeless 0  0 0  PHQ - 2 Score 0 0 0  Altered sleeping 0 0 0  Tired, decreased energy 0 0 0  Change in appetite 0 0 0  Feeling bad or failure about yourself  0 0 0  Trouble concentrating 0 0 0  Moving slowly or fidgety/restless 0 0 0  Suicidal thoughts 0 0 0  PHQ-9 Score 0 0 0  Difficult doing work/chores Not difficult at all Not difficult at all Not difficult at all    BP Readings from Last 3 Encounters:  07/04/23 134/76  06/09/23 122/74  04/26/23 132/80    Physical Exam Vitals and nursing note reviewed.  Constitutional:      Appearance: Normal appearance. He is well-developed.  HENT:     Head: Normocephalic.     Right Ear: Tympanic membrane, ear canal and external ear normal.     Left Ear: Tympanic membrane, ear canal and external ear normal.     Nose: Nose normal.     Right Sinus: No maxillary sinus tenderness or frontal sinus tenderness.     Left Sinus: No maxillary sinus tenderness or frontal sinus tenderness.     Mouth/Throat:     Pharynx: Oropharynx is clear.  Eyes:     Conjunctiva/sclera: Conjunctivae normal.     Pupils: Pupils are equal, round, and reactive to light.  Neck:     Thyroid: No thyromegaly.     Vascular: No carotid bruit.  Cardiovascular:     Rate and Rhythm:  Normal rate and regular rhythm.     Heart sounds: Normal heart sounds.  Pulmonary:     Effort: Pulmonary effort is normal.     Breath sounds: Normal breath sounds. No wheezing.  Chest:  Breasts:    Right: No mass.     Left: No mass.  Abdominal:     General: Bowel sounds are normal.     Palpations: Abdomen is soft.     Tenderness: There is no abdominal tenderness.  Musculoskeletal:        General: Normal range of motion.     Cervical back: Normal range of motion and neck supple.     Right lower leg: No edema.     Left lower leg: No edema.  Lymphadenopathy:     Cervical: No cervical adenopathy.  Skin:    General: Skin is warm and dry.     Capillary Refill: Capillary refill takes less than 2 seconds.  Neurological:     General: No focal deficit present.     Mental Status: He is alert and oriented to person, place, and time.     Deep Tendon Reflexes: Reflexes are normal and symmetric.  Psychiatric:        Attention and Perception: Attention normal.        Mood and Affect: Mood normal.        Thought Content: Thought content normal.     Wt Readings from Last 3 Encounters:  07/04/23 190 lb 3.2 oz (86.3 kg)  06/09/23 191 lb (86.6 kg)  04/26/23 191 lb 9.6 oz (86.9 kg)    BP 134/76 (BP Location: Left Arm, Cuff Size: Large)   Pulse 81   Ht 5\' 5"  (1.651 m)   Wt 190 lb 3.2 oz (86.3 kg)   SpO2 98%   BMI 31.65 kg/m   Assessment and Plan:  Problem List Items Addressed This Visit       Unprioritized   Aortic atherosclerosis (HCC) (Chronic)   Gout    Flares  reduced on daily allopurinol.      Relevant Orders   Uric acid   Dyslipidemia (Chronic)    LDL is  Lab Results  Component Value Date   LDLCALC 48 06/30/2022    Current regimen is Crestor.  Tolerating medications well without issues.       Relevant Orders   Lipid panel   Essential (primary) hypertension (Chronic)    Controlled BP with normal exam. Current regimen is irbesartan, amlodipine, and Catapres.  He  has spironolactone on his list but is probably not taking it. Will continue same medications; encourage continued reduced sodium diet. Bring all medications to next appointment.       Relevant Orders   CBC with Differential/Platelet   Comprehensive metabolic panel   Urinalysis, Routine w reflex microscopic   Chronic kidney disease, stage 3a (HCC) (Chronic)    Monitoring regularly Pt avoiding nsaids regularly but uses Colchicine PRN      Relevant Orders   Comprehensive metabolic panel   Prediabetes (Chronic)    Managed with diet alone. Lab Results  Component Value Date   HGBA1C 6.4 (H) 06/30/2022         Relevant Orders   Hemoglobin A1c   Chronic congestion of paranasal sinus    Continue claritin and Flonase Use Neti pot as needed Consider ENT evaluation      Other Visit Diagnoses     Annual physical exam    -  Primary   Need for COVID-19 vaccine       Relevant Orders   Pfizer Comirnaty Covid -19 Vaccine 94yrs and older (Completed)       Return in about 4 months (around 11/01/2023) for HTN.    Reubin Milan, MD [facility] Primary Care and Sports Medicine [place]

## 2023-07-04 NOTE — Assessment & Plan Note (Signed)
Managed with diet alone. Lab Results  Component Value Date   HGBA1C 6.4 (H) 06/30/2022

## 2023-07-04 NOTE — Assessment & Plan Note (Signed)
Flares reduced on daily allopurinol.

## 2023-07-04 NOTE — Assessment & Plan Note (Signed)
Monitoring regularly Pt avoiding nsaids regularly but uses Colchicine PRN

## 2023-07-04 NOTE — Assessment & Plan Note (Signed)
LDL is  Lab Results  Component Value Date   LDLCALC 48 06/30/2022    Current regimen is Crestor.  Tolerating medications well without issues.

## 2023-07-04 NOTE — Assessment & Plan Note (Addendum)
Controlled BP with normal exam. Current regimen is irbesartan, amlodipine, and Catapres.  He has spironolactone on his list but is probably not taking it. Will continue same medications; encourage continued reduced sodium diet. Bring all medications to next appointment.

## 2023-07-05 LAB — CBC WITH DIFFERENTIAL/PLATELET
Basophils Absolute: 0.1 10*3/uL (ref 0.0–0.2)
Basos: 1 %
EOS (ABSOLUTE): 0.7 10*3/uL — ABNORMAL HIGH (ref 0.0–0.4)
Eos: 13 %
Hematocrit: 41.7 % (ref 37.5–51.0)
Hemoglobin: 12.8 g/dL — ABNORMAL LOW (ref 13.0–17.7)
Immature Grans (Abs): 0 10*3/uL (ref 0.0–0.1)
Immature Granulocytes: 0 %
Lymphocytes Absolute: 1.4 10*3/uL (ref 0.7–3.1)
Lymphs: 27 %
MCH: 27.8 pg (ref 26.6–33.0)
MCHC: 30.7 g/dL — ABNORMAL LOW (ref 31.5–35.7)
MCV: 91 fL (ref 79–97)
Monocytes Absolute: 0.6 10*3/uL (ref 0.1–0.9)
Monocytes: 12 %
Neutrophils Absolute: 2.4 10*3/uL (ref 1.4–7.0)
Neutrophils: 47 %
Platelets: 283 10*3/uL (ref 150–450)
RBC: 4.6 x10E6/uL (ref 4.14–5.80)
RDW: 14.7 % (ref 11.6–15.4)
WBC: 5.2 10*3/uL (ref 3.4–10.8)

## 2023-07-05 LAB — URINALYSIS, ROUTINE W REFLEX MICROSCOPIC
Bilirubin, UA: NEGATIVE
Glucose, UA: NEGATIVE
Ketones, UA: NEGATIVE
Leukocytes,UA: NEGATIVE
Nitrite, UA: NEGATIVE
RBC, UA: NEGATIVE
Specific Gravity, UA: 1.022 (ref 1.005–1.030)
Urobilinogen, Ur: 0.2 mg/dL (ref 0.2–1.0)
pH, UA: 5.5 (ref 5.0–7.5)

## 2023-07-05 LAB — HEMOGLOBIN A1C
Est. average glucose Bld gHb Est-mCnc: 143 mg/dL
Hgb A1c MFr Bld: 6.6 % — ABNORMAL HIGH (ref 4.8–5.6)

## 2023-07-05 LAB — COMPREHENSIVE METABOLIC PANEL
ALT: 15 [IU]/L (ref 0–44)
AST: 18 [IU]/L (ref 0–40)
Albumin: 4.1 g/dL (ref 3.8–4.8)
Alkaline Phosphatase: 98 [IU]/L (ref 44–121)
BUN/Creatinine Ratio: 13 (ref 10–24)
BUN: 21 mg/dL (ref 8–27)
Bilirubin Total: 0.4 mg/dL (ref 0.0–1.2)
CO2: 20 mmol/L (ref 20–29)
Calcium: 9.3 mg/dL (ref 8.6–10.2)
Chloride: 105 mmol/L (ref 96–106)
Creatinine, Ser: 1.65 mg/dL — ABNORMAL HIGH (ref 0.76–1.27)
Globulin, Total: 3.1 g/dL (ref 1.5–4.5)
Glucose: 99 mg/dL (ref 70–99)
Potassium: 4.1 mmol/L (ref 3.5–5.2)
Sodium: 142 mmol/L (ref 134–144)
Total Protein: 7.2 g/dL (ref 6.0–8.5)
eGFR: 42 mL/min/{1.73_m2} — ABNORMAL LOW (ref >59)

## 2023-07-05 LAB — URIC ACID: Uric Acid: 7.1 mg/dL (ref 3.8–8.4)

## 2023-07-05 LAB — LIPID PANEL
Chol/HDL Ratio: 5.9 ratio — ABNORMAL HIGH (ref 0.0–5.0)
Cholesterol, Total: 154 mg/dL (ref 100–199)
HDL: 26 mg/dL — ABNORMAL LOW (ref >39)
LDL Chol Calc (NIH): 74 mg/dL (ref 0–99)
Triglycerides: 332 mg/dL — ABNORMAL HIGH (ref 0–149)
VLDL Cholesterol Cal: 54 mg/dL — ABNORMAL HIGH (ref 5–40)

## 2023-07-05 LAB — MICROSCOPIC EXAMINATION
Bacteria, UA: NONE SEEN
Casts: NONE SEEN /[LPF]
Epithelial Cells (non renal): NONE SEEN /[HPF] (ref 0–10)
RBC, Urine: NONE SEEN /[HPF] (ref 0–2)
WBC, UA: NONE SEEN /[HPF] (ref 0–5)

## 2023-07-21 ENCOUNTER — Other Ambulatory Visit: Payer: Self-pay | Admitting: Internal Medicine

## 2023-07-21 DIAGNOSIS — M1000 Idiopathic gout, unspecified site: Secondary | ICD-10-CM

## 2023-07-24 ENCOUNTER — Other Ambulatory Visit: Payer: Self-pay

## 2023-07-24 NOTE — Telephone Encounter (Signed)
Requested medication (s) are due for refill today: yes  Requested medication (s) are on the active medication list: yes  Last refill:  07/04/22 #30  Future visit scheduled: yes  Notes to clinic:  Cr out of normal range   Requested Prescriptions  Pending Prescriptions Disp Refills   colchicine 0.6 MG tablet [Pharmacy Med Name: Colchicine 0.6 MG Oral Tablet] 30 tablet 0    Sig: Take 1 tablet by mouth twice daily as needed     Endocrinology:  Gout Agents - colchicine Failed - 07/21/2023 12:53 PM      Failed - Cr in normal range and within 360 days    Creatinine, Ser  Date Value Ref Range Status  07/04/2023 1.65 (H) 0.76 - 1.27 mg/dL Final         Passed - ALT in normal range and within 360 days    ALT  Date Value Ref Range Status  07/04/2023 15 0 - 44 IU/L Final         Passed - AST in normal range and within 360 days    AST  Date Value Ref Range Status  07/04/2023 18 0 - 40 IU/L Final         Passed - Valid encounter within last 12 months    Recent Outpatient Visits           2 weeks ago Annual physical exam   [place] [facility] at [facility], Nyoka Cowden, MD   1 month ago Acute non-recurrent maxillary sinusitis   [place] [facility] at [facility], Nyoka Cowden, MD   1 year ago Annual physical exam   [facility] at [facility], Nyoka Cowden, MD   1 year ago Essential (primary) hypertension   [place] [facility] at [facility], Nyoka Cowden, MD   1 year ago Acute non-recurrent maxillary sinusitis   [place] [facility] at [facility], Nyoka Cowden, MD       Future Appointments             In 3 months Judithann Graves Nyoka Cowden, MD [facility] Primary Care & Sports Medicine at [facility], [facility]   In 11 months Reubin Milan, MD [facility] Primary Care & Sports Medicine at [facility], [facility]             Passed - CBC within normal limits and completed in the last 12 months    WBC  Date Value Ref Range Status  07/04/2023 5.2 3.4 - 10.8 x10E3/uL Final   RBC  Date Value Ref Range Status  07/04/2023 4.60 4.14 - 5.80 x10E6/uL Final   Hemoglobin  Date Value Ref Range Status  07/04/2023 12.8 (L) 13.0 - 17.7 g/dL Final   Hematocrit  Date Value Ref Range Status  07/04/2023 41.7 37.5 - 51.0 % Final   MCHC  Date Value Ref Range Status  07/04/2023 30.7 (L) 31.5 - 35.7 g/dL Final   [facility]  Date Value Ref Range Status  07/04/2023 27.8 26.6 - 33.0 pg Final   MCV  Date Value Ref Range Status  07/04/2023 91 79 - 97 fL Final   No results found for: "PLTCOUNTKUC", "LABPLAT", "POCPLA" RDW  Date Value Ref Range Status  07/04/2023 14.7 11.6 - 15.4 % Final

## 2023-07-28 ENCOUNTER — Other Ambulatory Visit: Payer: Self-pay | Admitting: Internal Medicine

## 2023-07-28 DIAGNOSIS — M1000 Idiopathic gout, unspecified site: Secondary | ICD-10-CM

## 2023-08-01 NOTE — Telephone Encounter (Signed)
Requested Prescriptions  Pending Prescriptions Disp Refills   allopurinol (ZYLOPRIM) 100 MG tablet [Pharmacy Med Name: Allopurinol 100 MG Oral Tablet] 180 tablet 0    Sig: Take 2 tablets by mouth once daily     Endocrinology:  Gout Agents - allopurinol Failed - 07/28/2023  4:10 PM      Failed - Cr in normal range and within 360 days    Creatinine, Ser  Date Value Ref Range Status  07/04/2023 1.65 (H) 0.76 - 1.27 mg/dL Final         Passed - Uric Acid in normal range and within 360 days    Uric Acid  Date Value Ref Range Status  07/04/2023 7.1 3.8 - 8.4 mg/dL Final    Comment:               Therapeutic target for gout patients: <6.0         Passed - Valid encounter within last 12 months    Recent Outpatient Visits           4 weeks ago Annual physical exam   [place] [facility] at [facility], Nyoka Cowden, MD   1 month ago Acute non-recurrent maxillary sinusitis   [place] [facility] at [facility], Nyoka Cowden, MD   1 year ago Annual physical exam   [facility] Primary Care & Sports Medicine at [facility], Nyoka Cowden, MD   1 year ago Essential (primary) hypertension   [place] [facility] at [facility], Nyoka Cowden, MD   1 year ago Acute non-recurrent maxillary sinusitis    Primary Care & Sports Medicine at [facility], Nyoka Cowden, MD       Future Appointments             In 3 months Judithann Graves Nyoka Cowden, MD [facility] at [facility], [facility]   In 11 months Reubin Milan, MD [facility] Primary Care & Sports Medicine at [facility], [facility]            Passed - CBC within normal limits and completed in the last 12 months    WBC  Date Value Ref Range Status  07/04/2023 5.2 3.4 - 10.8 x10E3/uL Final   RBC  Date Value Ref Range Status  07/04/2023 4.60 4.14 - 5.80 x10E6/uL Final   Hemoglobin  Date  Value Ref Range Status  07/04/2023 12.8 (L) 13.0 - 17.7 g/dL Final   Hematocrit  Date Value Ref Range Status  07/04/2023 41.7 37.5 - 51.0 % Final   MCHC  Date Value Ref Range Status  07/04/2023 30.7 (L) 31.5 - 35.7 g/dL Final   [facility]  Date Value Ref Range Status  07/04/2023 27.8 26.6 - 33.0 pg Final   MCV  Date Value Ref Range Status  07/04/2023 91 79 - 97 fL Final   No results found for: "PLTCOUNTKUC", "LABPLAT", "POCPLA" RDW  Date Value Ref Range Status  07/04/2023 14.7 11.6 - 15.4 % Final

## 2023-09-10 ENCOUNTER — Other Ambulatory Visit: Payer: Self-pay | Admitting: Internal Medicine

## 2023-09-10 DIAGNOSIS — I10 Essential (primary) hypertension: Secondary | ICD-10-CM

## 2023-09-12 NOTE — Telephone Encounter (Signed)
 Requested Prescriptions  Pending Prescriptions Disp Refills   cloNIDine  (CATAPRES ) 0.1 MG tablet [Pharmacy Med Name: cloNIDine  HCl 0.1 MG Oral Tablet] 180 tablet 0    Sig: Take 1 tablet by mouth twice daily     Cardiovascular:  Alpha-2 Agonists Passed - 09/12/2023 11:29 AM      Passed - Last BP in normal range    BP Readings from Last 1 Encounters:  07/04/23 134/76         Passed - Last Heart Rate in normal range    Pulse Readings from Last 1 Encounters:  07/04/23 81         Passed - Valid encounter within last 6 months    Recent Outpatient Visits           2 months ago Annual physical exam   [place] [facility] at [facility], Leita DEL, MD   3 months ago Acute non-recurrent maxillary sinusitis   [place] [facility] at [facility], Leita DEL, MD   1 year ago Annual physical exam   [facility] Primary Care & Sports Medicine at [facility], Leita DEL, MD   1 year ago Essential (primary) hypertension   [place] [facility] at [facility], Leita DEL, MD   1 year ago Acute non-recurrent maxillary sinusitis   [place] [facility] Care [facility] Sports Medicine at [facility], Leita DEL, MD       Future Appointments             In 1 month Ad[place], Leita DEL, MD [facility] Primary Care & Sports Medicine at [facility], [facility]   In 9 months Adie, Leita DEL, MD [facility] Primary Care & Sports Medicine at [facility], [facility]

## 2023-09-18 ENCOUNTER — Other Ambulatory Visit: Payer: Self-pay | Admitting: Internal Medicine

## 2023-09-18 DIAGNOSIS — G8929 Other chronic pain: Secondary | ICD-10-CM

## 2023-09-18 NOTE — Telephone Encounter (Signed)
Requested Prescriptions  Pending Prescriptions Disp Refills   gabapentin (NEURONTIN) 300 MG capsule [Pharmacy Med Name: Gabapentin 300 MG Oral Capsule] 90 capsule 0    Sig: Take 1 capsule by mouth at bedtime     Neurology: Anticonvulsants - gabapentin Failed - 09/18/2023 12:20 PM      Failed - Cr in normal range and within 360 days    Creatinine, Ser  Date Value Ref Range Status  07/04/2023 1.65 (H) 0.76 - 1.27 mg/dL Final         Passed - Completed PHQ-2 or PHQ-9 in the last 360 days      Passed - Valid encounter within last 12 months    Recent Outpatient Visits           2 months ago Annual physical exam   [place] [facility] at [facility], Nyoka Cowden, MD   3 months ago Acute non-recurrent maxillary sinusitis   [place] [facility] at [facility], Nyoka Cowden, MD   1 year ago Annual physical exam   [facility] Primary Care & Sports Medicine at [facility], Nyoka Cowden, MD   1 year ago Essential (primary) hypertension   [place] [facility] Medicine at [facility], Nyoka Cowden, MD   1 year ago Acute non-recurrent maxillary sinusitis   [place] [facility] Medicine at [facility], Nyoka Cowden, MD       Future Appointments             In 1 month Judithann Graves, Nyoka Cowden, MD [facility] Health Primary Care & Sports Medicine at [facility], [facility]   In 9 months Judithann Graves, Nyoka Cowden, MD [facility] Health Primary Care & Sports Medicine at [facility], [facility]

## 2023-10-21 ENCOUNTER — Other Ambulatory Visit: Payer: Self-pay | Admitting: Internal Medicine

## 2023-10-21 DIAGNOSIS — I10 Essential (primary) hypertension: Secondary | ICD-10-CM

## 2023-10-21 DIAGNOSIS — E785 Hyperlipidemia, unspecified: Secondary | ICD-10-CM

## 2023-10-23 NOTE — Telephone Encounter (Signed)
 Requested Prescriptions  Pending Prescriptions Disp Refills   irbesartan (AVAPRO) 300 MG tablet [Pharmacy Med Name: Irbesartan 300 MG Oral Tablet] 90 tablet 0    Sig: Take 1 tablet by mouth once daily     Cardiovascular:  Angiotensin Receptor Blockers Failed - 10/23/2023  3:00 PM      Failed - Cr in normal range and within 180 days    Creatinine, Ser  Date Value Ref Range Status  07/04/2023 1.65 (H) 0.76 - 1.27 mg/dL Final         Passed - K in normal range and within 180 days    Potassium  Date Value Ref Range Status  07/04/2023 4.1 3.5 - 5.2 mmol/L Final         Passed - Patient is not pregnant      Passed - Last BP in normal range    BP Readings from Last 1 Encounters:  07/04/23 134/76         Passed - Valid encounter within last 6 months    Recent Outpatient Visits           3 months ago Annual physical exam   [place] [facility] at [facility], Nyoka Cowden, MD   4 months ago Acute non-recurrent maxillary sinusitis   [place] [facility] at [facility], Nyoka Cowden, MD   1 year ago Annual physical exam   [facility] at [facility], Nyoka Cowden, MD   1 year ago Essential (primary) hypertension   [place] [facility] at [facility], Nyoka Cowden, MD   1 year ago Acute non-recurrent maxillary sinusitis   [place] [facility] at [facility], Nyoka Cowden, MD       Future Appointments             In 1 week Judithann Graves Nyoka Cowden, MD [facility] Primary Care & Sports Medicine at [facility], [facility]   In 8 months Reubin Milan, MD [facility] at [facility], [facility]             amLODipine (NORVASC) 10 MG tablet [Pharmacy Med Name: amLODIPine Besylate 10 MG Oral Tablet] 90 tablet 0    Sig: Take 1 tablet by mouth once daily     Cardiovascular: Calcium Channel Blockers  2 Passed - 10/23/2023  3:00 PM      Passed - Last BP in normal range    BP Readings from Last 1 Encounters:  07/04/23 134/76         Passed - Last Heart Rate in normal range    Pulse Readings from Last 1 Encounters:  07/04/23 81         Passed - Valid encounter within last 6 months    Recent Outpatient Visits           3 months ago Annual physical exam   [place] [facility] at [facility], Nyoka Cowden, MD   4 months ago Acute non-recurrent maxillary sinusitis   [place] [facility] at [facility], Nyoka Cowden, MD   1 year ago Annual physical exam   [facility] at [facility], Nyoka Cowden, MD   1 year ago Essential (primary) hypertension   [place] [facility] at [facility]  Charlann Boxer, MD   1 year ago Acute non-recurrent maxillary sinusitis   [place] [facility] at [facility], Nyoka Cowden, MD       Future Appointments             In 1 week Judithann Graves Nyoka Cowden, MD [facility] Primary [facility] & [facility] at [facility], [facility]   In 8 months Reubin Milan, MD [facility] at [facility], [facility]             rosuvastatin (CRESTOR) 40 MG tablet [Pharmacy Med Name: Rosuvastatin Calcium 40 MG Oral Tablet] 90 tablet 0    Sig: Take 1 tablet by mouth once daily     Cardiovascular:  Antilipid - Statins 2 Failed - 10/23/2023  3:00 PM      Failed - Cr in normal range and within 360 days    Creatinine, Ser  Date Value Ref Range Status  07/04/2023 1.65 (H) 0.76 - 1.27 mg/dL Final         Failed - Lipid Panel in normal range within the last 12 months    Cholesterol, Total  Date Value Ref Range Status  07/04/2023 154 100 - 199 mg/dL Final   LDL Chol Calc (NIH)  Date Value Ref Range Status  07/04/2023 74 0 - 99 mg/dL Final   HDL  Date Value Ref Range Status   07/04/2023 26 (L) >39 mg/dL Final   Triglycerides  Date Value Ref Range Status  07/04/2023 332 (H) 0 - 149 mg/dL Final         Passed - Patient is not pregnant      Passed - Valid encounter within last 12 months    Recent Outpatient Visits           3 months ago Annual physical exam   [place] [facility] at [facility], Nyoka Cowden, MD   4 months ago Acute non-recurrent maxillary sinusitis   [place] [facility] at [facility], Nyoka Cowden, MD   1 year ago Annual physical exam   [facility] at [facility], Nyoka Cowden, MD   1 year ago Essential (primary) hypertension   [place] [facility] at [facility], Nyoka Cowden, MD   1 year ago Acute non-recurrent maxillary sinusitis    Primary Care & Sports Medicine at [facility], Nyoka Cowden, MD       Future Appointments             In 1 week Judithann Graves, Nyoka Cowden, MD [facility] Primary Care & Sports Medicine at [facility], [facility]   In 8 months Judithann Graves, Nyoka Cowden, MD [facility] Primary Care & Sports Medicine at [facility], [facility]

## 2023-11-03 ENCOUNTER — Encounter: Payer: Self-pay | Admitting: Internal Medicine

## 2023-11-03 ENCOUNTER — Other Ambulatory Visit: Payer: Self-pay | Admitting: Internal Medicine

## 2023-11-03 ENCOUNTER — Ambulatory Visit (INDEPENDENT_AMBULATORY_CARE_PROVIDER_SITE_OTHER): Payer: Medicare (Managed Care) | Admitting: Internal Medicine

## 2023-11-03 VITALS — BP 160/92 | HR 109 | Ht 65.0 in | Wt 181.5 lb

## 2023-11-03 DIAGNOSIS — M5442 Lumbago with sciatica, left side: Secondary | ICD-10-CM

## 2023-11-03 DIAGNOSIS — R7303 Prediabetes: Secondary | ICD-10-CM | POA: Diagnosis not present

## 2023-11-03 DIAGNOSIS — N1831 Chronic kidney disease, stage 3a: Secondary | ICD-10-CM | POA: Diagnosis not present

## 2023-11-03 DIAGNOSIS — M1000 Idiopathic gout, unspecified site: Secondary | ICD-10-CM

## 2023-11-03 DIAGNOSIS — I10 Essential (primary) hypertension: Secondary | ICD-10-CM

## 2023-11-03 DIAGNOSIS — G8929 Other chronic pain: Secondary | ICD-10-CM

## 2023-11-03 NOTE — Assessment & Plan Note (Addendum)
 uncontrolled BP with normal exam.  He admits to being out of medications for some time and only restarting them a few days ago. Current regimen is Avapro, amlodipine, clonidine and spironolactone. Will continue same medications and encourage compliance plus reduced sodium diet. He will monitor at home and if not improving in several weeks, he will call for instructions

## 2023-11-03 NOTE — Telephone Encounter (Signed)
 Requested Prescriptions  Pending Prescriptions Disp Refills   allopurinol (ZYLOPRIM) 100 MG tablet [Pharmacy Med Name: Allopurinol 100 MG Oral Tablet] 180 tablet 1    Sig: Take 2 tablets by mouth once daily     Endocrinology:  Gout Agents - allopurinol Failed - 11/03/2023  3:35 PM      Failed - Cr in normal range and within 360 days    Creatinine, Ser  Date Value Ref Range Status  07/04/2023 1.65 (H) 0.76 - 1.27 mg/dL Final         Passed - Uric Acid in normal range and within 360 days    Uric Acid  Date Value Ref Range Status  07/04/2023 7.1 3.8 - 8.4 mg/dL Final    Comment:               Therapeutic target for gout patients: <6.0         Passed - Valid encounter within last 12 months    Recent Outpatient Visits           4 months ago Annual physical exam   [place] [facility] at [facility], Nyoka Cowden, MD   4 months ago Acute non-recurrent maxillary sinusitis   [place] [facility] at [facility], Nyoka Cowden, MD   1 year ago Annual physical exam   [facility] Primary [facility] & Sports Medicine at [facility], Nyoka Cowden, MD   1 year ago Essential (primary) hypertension   [place] [facility] at [facility], Nyoka Cowden, MD   2 years ago Acute non-recurrent maxillary sinusitis   [place] [facility] at [facility], Nyoka Cowden, MD       Future Appointments             In 3 months Judithann Graves Nyoka Cowden, MD [facility] Primary Care & Sports Medicine at [facility], [facility]   In 8 months Reubin Milan, MD [facility] Primary Care & Sports Medicine at [facility], [facility]            Passed - CBC within normal limits and completed in the last 12 months    WBC  Date Value Ref Range Status  07/04/2023 5.2 3.4 - 10.8 x10E3/uL Final   RBC  Date Value Ref Range Status  07/04/2023 4.60 4.14 - 5.80 x10E6/uL Final   Hemoglobin  Date  Value Ref Range Status  07/04/2023 12.8 (L) 13.0 - 17.7 g/dL Final   Hematocrit  Date Value Ref Range Status  07/04/2023 41.7 37.5 - 51.0 % Final   MCHC  Date Value Ref Range Status  07/04/2023 30.7 (L) 31.5 - 35.7 g/dL Final   [facility]  Date Value Ref Range Status  07/04/2023 27.8 26.6 - 33.0 pg Final   MCV  Date Value Ref Range Status  07/04/2023 91 79 - 97 fL Final   No results found for: "PLTCOUNTKUC", "LABPLAT", "POCPLA" RDW  Date Value Ref Range Status  07/04/2023 14.7 11.6 - 15.4 % Final

## 2023-11-03 NOTE — Assessment & Plan Note (Addendum)
 He started Physical therapy for his low back pain and general conditioning before he had an order. I will sign the PT forms provided from [facility].

## 2023-11-03 NOTE — Assessment & Plan Note (Signed)
 Being monitored regularly. GFR has been stable in the 40's

## 2023-11-03 NOTE — Progress Notes (Signed)
 Date:  11/03/2023   Name:  Edward Rogers   DOB:  [birth year]   MRN:  [id number]   Chief Complaint: Hypertension  Hypertension This is a chronic problem. The problem is uncontrolled. Pertinent negatives include no chest pain, palpitations or shortness of breath. Past treatments include angiotensin blockers, calcium channel blockers, beta blockers and diuretics. The current treatment provides moderate improvement. Hypertensive end-organ damage includes kidney disease. There is no history of CAD/MI or CVA.  Diabetes He presents for his follow-up diabetic visit. Diabetes type: prediabetes. His disease course has been improving. Pertinent negatives for hypoglycemia include no dizziness or nervousness/anxiousness. Pertinent negatives for diabetes include no chest pain and no fatigue. Pertinent negatives for diabetic complications include no CVA. Current diabetic treatment includes diet (and recent 10 lb weight loss).  Back Pain This is a chronic problem. The pain is present in the lumbar spine. The quality of the pain is described as aching. Pertinent negatives include no chest pain or fever. Treatments tried: recently started PTx.    Review of Systems  Constitutional:  Positive for unexpected weight change (has lost 10 lbs with effort). Negative for chills, fatigue and fever.  Eyes:  Negative for visual disturbance.  Respiratory:  Negative for chest tightness, shortness of breath and wheezing.   Cardiovascular:  Negative for chest pain and palpitations.  Musculoskeletal:  Positive for back pain.  Neurological:  Negative for dizziness and light-headedness.  Psychiatric/Behavioral:  Negative for dysphoric mood and sleep disturbance. The patient is not nervous/anxious.      Lab Results  Component Value Date   NA 142 07/04/2023   K 4.1 07/04/2023   CO2 20 07/04/2023   GLUCOSE 99 07/04/2023   BUN 21 07/04/2023   CREATININE 1.65 (H) 07/04/2023   CALCIUM 9.3 07/04/2023   EGFR 42 (L)  07/04/2023   GFRNONAA 40 (L) 07/07/2020   Lab Results  Component Value Date   CHOL 154 07/04/2023   HDL 26 (L) 07/04/2023   LDLCALC 74 07/04/2023   TRIG 332 (H) 07/04/2023   CHOLHDL 5.9 (H) 07/04/2023   Lab Results  Component Value Date   TSH 1.200 06/30/2021   Lab Results  Component Value Date   HGBA1C 6.6 (H) 07/04/2023   Lab Results  Component Value Date   WBC 5.2 07/04/2023   HGB 12.8 (L) 07/04/2023   HCT 41.7 07/04/2023   MCV 91 07/04/2023   PLT 283 07/04/2023   Lab Results  Component Value Date   ALT 15 07/04/2023   AST 18 07/04/2023   ALKPHOS 98 07/04/2023   BILITOT 0.4 07/04/2023   No results found for: "25OHVITD2", "25OHVITD3", "VD25OH"   Patient Active Problem List   Diagnosis Date Noted   Chronic congestion of paranasal sinus 07/04/2023   Prediabetes 07/01/2021   Lumbar radiculopathy 07/17/2020   Aortic atherosclerosis (HCC) 07/06/2020   Chronic kidney disease, stage 3a (HCC) 07/06/2020   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   Arthritis of joint of toe 06/01/2017   Chronic left-sided low back pain with left-sided sciatica 11/30/2016   Gout 04/11/2015   Dyslipidemia 04/11/2015   Essential (primary) hypertension 04/11/2015   Sarcoidosis 04/11/2015   Other allergic rhinitis 04/11/2015   Tobacco use disorder, moderate, in sustained remission 04/11/2015   Elevated PSA 04/11/2015    No Known Allergies  Past Surgical History:  Procedure Laterality Date   COLONOSCOPY  2009   normal   PROSTATE BIOPSY  2014   normal    Social History  Tobacco Use   Smoking status: Former    Current packs/day: 0.00    Average packs/day: 0.5 packs/day for 30.0 years (15.0 ttl pk-yrs)    Types: Cigarettes    Start date: 08/30/1982    Quit date: 08/30/2012    Years since quitting: 11.1   Smokeless tobacco: Never  Vaping Use   Vaping status: Never Used  Substance Use Topics   Alcohol use: Yes    Alcohol/week: 0.0 standard drinks of alcohol     Comment: rarely   Drug use: No     Medication list has been reviewed and updated.  Current Meds  Medication Sig   albuterol (VENTOLIN HFA) 108 (90 Base) MCG/ACT inhaler INHALE 2 PUFFS BY MOUTH EVERY 6 HOURS AS NEEDED FOR WHEEZING FOR SHORTNESS OF BREATH   allopurinol (ZYLOPRIM) 100 MG tablet Take 2 tablets by mouth once daily   amLODipine (NORVASC) 10 MG tablet Take 1 tablet by mouth once daily   Azelastine HCl 137 MCG/SPRAY SOLN USE 2 SPRAY(S) IN EACH NOSTRIL TWICE DAILY AS DIRECTED   cloNIDine (CATAPRES) 0.1 MG tablet Take 1 tablet by mouth twice daily   colchicine 0.6 MG tablet Take 1 tablet by mouth twice daily as needed   fluticasone (FLONASE) 50 MCG/ACT nasal spray Use 2 spray(s) in each nostril once daily   gabapentin (NEURONTIN) 300 MG capsule Take 1 capsule by mouth at bedtime   irbesartan (AVAPRO) 300 MG tablet Take 1 tablet by mouth once daily   rosuvastatin (CRESTOR) 40 MG tablet Take 1 tablet by mouth once daily   spironolactone (ALDACTONE) 25 MG tablet Take 1 tablet (25 mg total) by mouth daily.       11/03/2023   11:04 AM 07/04/2023    8:28 AM 06/09/2023    4:25 PM 12/28/2021    9:53 AM  GAD 7 : Generalized Anxiety Score  Nervous, Anxious, on Edge 1 0 0 0  Control/stop worrying 0 0 0 0  Worry too much - different things 0 0 0 0  Trouble relaxing 0 0 0 0  Restless 0 0 0 0  Easily annoyed or irritable 0 0 0 0  Afraid - awful might happen 0 0 0 0  Total GAD 7 Score 1 0 0 0  Anxiety Difficulty  Not difficult at all Not difficult at all Not difficult at all       11/03/2023   11:04 AM 07/04/2023    8:27 AM 06/09/2023    4:25 PM  Depression screen PHQ 2/9  Decreased Interest 0 0 0  Down, Depressed, Hopeless 0 0 0  PHQ - 2 Score 0 0 0  Altered sleeping 0 0 0  Tired, decreased energy 0 0 0  Change in appetite 0 0 0  Feeling bad or failure about yourself  0 0 0  Trouble concentrating 0 0 0  Moving slowly or fidgety/restless 0 0 0  Suicidal thoughts 0 0 0  PHQ-9  Score 0 0 0  Difficult doing work/chores Not difficult at all Not difficult at all Not difficult at all    BP Readings from Last 3 Encounters:  11/03/23 (!) 160/92  07/04/23 134/76  06/09/23 122/74    Physical Exam Vitals and nursing note reviewed.  Constitutional:      General: He is not in acute distress.    Appearance: Normal appearance. He is well-developed.  HENT:     Head: Normocephalic and atraumatic.  Neck:     Vascular: No carotid bruit.  Cardiovascular:     Rate and Rhythm: Normal rate and regular rhythm.  Pulmonary:     Effort: Pulmonary effort is normal. No respiratory distress.     Breath sounds: No wheezing or rhonchi.  Musculoskeletal:     Cervical back: Normal range of motion.     Right lower leg: No edema.     Left lower leg: No edema.  Lymphadenopathy:     Cervical: No cervical adenopathy.  Skin:    General: Skin is warm and dry.     Findings: No rash.  Neurological:     Mental Status: He is alert and oriented to person, place, and time.  Psychiatric:        Mood and Affect: Mood normal.        Behavior: Behavior normal.     Wt Readings from Last 3 Encounters:  11/03/23 181 lb 8 oz (82.3 kg)  07/04/23 190 lb 3.2 oz (86.3 kg)  06/09/23 191 lb (86.6 kg)    BP (!) 160/92   Pulse (!) 109   Ht 5\' 5"  (1.651 m)   Wt 181 lb 8 oz (82.3 kg)   SpO2 100%   BMI 30.20 kg/m   Assessment and Plan:  Problem List Items Addressed This Visit       Unprioritized   Essential (primary) hypertension - Primary (Chronic)   uncontrolled BP with normal exam.  He admits to being out of medications for some time and only restarting them a few days ago. Current regimen is Avapro, amlodipine, clonidine and spironolactone. Will continue same medications and encourage compliance plus reduced sodium diet. He will monitor at home and if not improving in several weeks, he will call for instructions       Relevant Orders   CBC with Differential/Platelet    Comprehensive metabolic panel   Chronic kidney disease, stage 3a (HCC) (Chronic)   Being monitored regularly. GFR has been stable in the 40's      Prediabetes (Chronic)   He has done an excellent job of losing 10 lbs since his last visit. Lab Results  Component Value Date   HGBA1C 6.6 (H) 07/04/2023  Will recheck today.  He is congratulated on his weight loss success.      Relevant Orders   Hemoglobin A1c   Chronic left-sided low back pain with left-sided sciatica   He started Physical therapy for his low back pain and general conditioning before he had an order. I will sign the PT forms provided from [facility].       Return in about 4 months (around 03/04/2024) for DM, HTN.    Reubin Milan, MD [facility] Primary Care and Sports Medicine [place]

## 2023-11-03 NOTE — Assessment & Plan Note (Signed)
 He has done an excellent job of losing 10 lbs since his last visit. Lab Results  Component Value Date   HGBA1C 6.6 (H) 07/04/2023  Will recheck today.  He is congratulated on his weight loss success.

## 2023-11-04 ENCOUNTER — Encounter: Payer: Self-pay | Admitting: Internal Medicine

## 2023-11-04 LAB — COMPREHENSIVE METABOLIC PANEL
ALT: 17 IU/L (ref 0–44)
AST: 20 IU/L (ref 0–40)
Albumin: 4.2 g/dL (ref 3.8–4.8)
Alkaline Phosphatase: 98 IU/L (ref 44–121)
BUN/Creatinine Ratio: 10 (ref 10–24)
BUN: 19 mg/dL (ref 8–27)
Bilirubin Total: 0.4 mg/dL (ref 0.0–1.2)
CO2: 21 mmol/L (ref 20–29)
Calcium: 9.8 mg/dL (ref 8.6–10.2)
Chloride: 104 mmol/L (ref 96–106)
Creatinine, Ser: 1.82 mg/dL — ABNORMAL HIGH (ref 0.76–1.27)
Globulin, Total: 3.3 g/dL (ref 1.5–4.5)
Glucose: 115 mg/dL — ABNORMAL HIGH (ref 70–99)
Potassium: 3.9 mmol/L (ref 3.5–5.2)
Sodium: 142 mmol/L (ref 134–144)
Total Protein: 7.5 g/dL (ref 6.0–8.5)
eGFR: 37 mL/min/{1.73_m2} — ABNORMAL LOW (ref >59)

## 2023-11-04 LAB — CBC WITH DIFFERENTIAL/PLATELET
Basophils Absolute: 0.1 10*3/uL (ref 0.0–0.2)
Basos: 1 %
EOS (ABSOLUTE): 0.6 10*3/uL — ABNORMAL HIGH (ref 0.0–0.4)
Eos: 12 %
Hematocrit: 44.8 % (ref 37.5–51.0)
Hemoglobin: 14 g/dL (ref 13.0–17.7)
Immature Grans (Abs): 0 10*3/uL (ref 0.0–0.1)
Immature Granulocytes: 0 %
Lymphocytes Absolute: 1.8 10*3/uL (ref 0.7–3.1)
Lymphs: 37 %
MCH: 27.4 pg (ref 26.6–33.0)
MCHC: 31.3 g/dL — ABNORMAL LOW (ref 31.5–35.7)
MCV: 88 fL (ref 79–97)
Monocytes Absolute: 0.6 10*3/uL (ref 0.1–0.9)
Monocytes: 12 %
Neutrophils Absolute: 1.9 10*3/uL (ref 1.4–7.0)
Neutrophils: 38 %
Platelets: 258 10*3/uL (ref 150–450)
RBC: 5.11 x10E6/uL (ref 4.14–5.80)
RDW: 14.8 % (ref 11.6–15.4)
WBC: 4.9 10*3/uL (ref 3.4–10.8)

## 2023-11-04 LAB — HEMOGLOBIN A1C
Est. average glucose Bld gHb Est-mCnc: 137 mg/dL
Hgb A1c MFr Bld: 6.4 % — ABNORMAL HIGH (ref 4.8–5.6)

## 2024-01-04 DIAGNOSIS — R590 Localized enlarged lymph nodes: Secondary | ICD-10-CM | POA: Diagnosis not present

## 2024-01-04 DIAGNOSIS — D869 Sarcoidosis, unspecified: Secondary | ICD-10-CM | POA: Diagnosis not present

## 2024-01-04 DIAGNOSIS — R0602 Shortness of breath: Secondary | ICD-10-CM | POA: Diagnosis not present

## 2024-02-26 ENCOUNTER — Other Ambulatory Visit: Payer: Self-pay | Admitting: Internal Medicine

## 2024-02-26 DIAGNOSIS — E785 Hyperlipidemia, unspecified: Secondary | ICD-10-CM

## 2024-02-26 DIAGNOSIS — I10 Essential (primary) hypertension: Secondary | ICD-10-CM

## 2024-02-28 NOTE — Telephone Encounter (Signed)
 Requested Prescriptions  Pending Prescriptions Disp Refills   irbesartan  (AVAPRO ) 300 MG tablet [Pharmacy Med Name: Irbesartan  300 MG Oral Tablet] 90 tablet 0    Sig: Take 1 tablet by mouth once daily     Cardiovascular:  Angiotensin Receptor Blockers Failed - 02/28/2024  8:16 AM      Failed - Cr in normal range and within 180 days    Creatinine, Ser  Date Value Ref Range Status  11/03/2023 1.82 (H) 0.76 - 1.27 mg/dL Final         Failed - Last BP in normal range    BP Readings from Last 1 Encounters:  11/03/23 (!) 160/92         Passed - K in normal range and within 180 days    Potassium  Date Value Ref Range Status  11/03/2023 3.9 3.5 - 5.2 mmol/L Final         Passed - Patient is not pregnant      Passed - Valid encounter within last 6 months    Recent Outpatient Visits           3 months ago Essential (primary) hypertension   [place] [facility] at [facility], Leita DEL, MD       Future Appointments             Tomorrow Justus Leita DEL, MD [facility] Primary Care & Sports Medicine at [facility], [facility]   In 4 months Justus Leita DEL, MD [facility] Primary Care & Sports Medicine at [facility], [facility]             amLODipine  (NORVASC ) 10 MG tablet [Pharmacy Med Name: amLODIPine  Besylate 10 MG Oral Tablet] 90 tablet 0    Sig: Take 1 tablet by mouth once daily     Cardiovascular: Calcium  Channel Blockers 2 Failed - 02/28/2024  8:16 AM      Failed - Last BP in normal range    BP Readings from Last 1 Encounters:  11/03/23 (!) 160/92         Passed - Last Heart Rate in normal range    Pulse Readings from Last 1 Encounters:  11/03/23 (!) 109         Passed - Valid encounter within last 6 months    Recent Outpatient Visits           3 months ago Essential (primary) hypertension   [place] [facility] Medicine at [facility], Leita DEL, MD       Future Appointments              Tomorrow Justus Leita DEL, MD [facility] at [facility], [facility]   In 4 months Justus Leita DEL, MD [facility] Primary Care & Sports Medicine at [facility], [facility]             rosuvastatin  (CRESTOR ) 40 MG tablet [Pharmacy Med Name: Rosuvastatin  Calcium  40 MG Oral Tablet] 90 tablet 0    Sig: Take 1 tablet by mouth once daily     Cardiovascular:  Antilipid - Statins 2 Failed - 02/28/2024  8:16 AM      Failed - Cr in normal range and within 360 days    Creatinine, Ser  Date Value Ref Range Status  11/03/2023 1.82 (H) 0.76 - 1.27 mg/dL Final         Failed - Lipid Panel in normal range within the last  12 months    Cholesterol, Total  Date Value Ref Range Status  07/04/2023 154 100 - 199 mg/dL Final   LDL Chol Calc (NIH)  Date Value Ref Range Status  07/04/2023 74 0 - 99 mg/dL Final   HDL  Date Value Ref Range Status  07/04/2023 26 (L) >39 mg/dL Final   Triglycerides  Date Value Ref Range Status  07/04/2023 332 (H) 0 - 149 mg/dL Final         Passed - Patient is not pregnant      Passed - Valid encounter within last 12 months    Recent Outpatient Visits           3 months ago Essential (primary) hypertension   [place] [facility] Medicine at [facility], Leita DEL, MD       Future Appointments             Tomorrow Justus Leita DEL, MD [facility] Health Primary Care & Sports Medicine at [facility], [facility]   In 4 months Justus, Leita DEL, MD [facility] Health Primary Care & Sports Medicine at [facility], [facility]

## 2024-02-29 ENCOUNTER — Ambulatory Visit: Payer: Medicare (Managed Care) | Admitting: Internal Medicine

## 2024-03-19 DIAGNOSIS — H0288B Meibomian gland dysfunction left eye, upper and lower eyelids: Secondary | ICD-10-CM | POA: Diagnosis not present

## 2024-03-19 DIAGNOSIS — H0288A Meibomian gland dysfunction right eye, upper and lower eyelids: Secondary | ICD-10-CM | POA: Diagnosis not present

## 2024-03-19 DIAGNOSIS — H25813 Combined forms of age-related cataract, bilateral: Secondary | ICD-10-CM | POA: Diagnosis not present

## 2024-04-02 ENCOUNTER — Telehealth: Payer: Self-pay | Admitting: Internal Medicine

## 2024-04-02 NOTE — Telephone Encounter (Signed)
 Copied from CRM [id number]. Topic: Medicare AWV >> Apr 02, 2024  3:08 PM Nathanel DEL wrote: Reason for CRM: Called pt 04/02/24 to change AWV appt from in person to telephone due to [facility] not in office khc. Patient answered and hung up.  Nathanel Paschal; Care Guide Ambulatory Clinical Support [place] [facility] Direct Dial: [phone]

## 2024-05-01 ENCOUNTER — Ambulatory Visit: Payer: Medicare (Managed Care)

## 2024-05-07 DIAGNOSIS — H25813 Combined forms of age-related cataract, bilateral: Secondary | ICD-10-CM | POA: Diagnosis not present

## 2024-05-07 DIAGNOSIS — H0288A Meibomian gland dysfunction right eye, upper and lower eyelids: Secondary | ICD-10-CM | POA: Diagnosis not present

## 2024-05-07 DIAGNOSIS — H0288B Meibomian gland dysfunction left eye, upper and lower eyelids: Secondary | ICD-10-CM | POA: Diagnosis not present

## 2024-06-07 ENCOUNTER — Ambulatory Visit: Payer: Self-pay

## 2024-06-07 NOTE — Telephone Encounter (Signed)
 FYI Only or Action Required?: FYI only for provider.  Patient was last seen in primary care on 11/03/2023 by Justus Leita DEL, MD.  Called Nurse Triage reporting Anxiety.  Symptoms began about a month ago.  Interventions attempted: Nothing.  Symptoms are: gradually worsening.  Triage Disposition: See PCP When Office is Open (Within 3 Days)  Patient/caregiver understands and will follow disposition?: Yes      Copied from CRM [id number]. Topic: Clinical - Red Word Triage >> Jun 07, 2024  8:02 AM Emylou G wrote: [facility] that prompted transfer to Nurse Triage: anxiety Reason for Disposition  [1] Anxiety symptoms AND [2] has not been evaluated for this by doctor (or NP/PA)  Answer Assessment - Initial Assessment Questions 1. CONCERN: Did anything happen that prompted you to call today?      Wants to be seen for symptoms  2. ANXIETY SYMPTOMS: Can you describe how you (your loved one; patient) have been feeling? (e.g., tense, restless, panicky, anxious, keyed up, overwhelmed, sense of impending doom).      Overwhelmed, anxious, and panicky  3. ONSET: How long have you been feeling this way? (e.g., hours, days, weeks)     1 month  4. SEVERITY: How would you rate the level of anxiety? (e.g., 0 - 10; or mild, moderate, severe).     Was severe this morning , now resolved  5. FUNCTIONAL IMPAIRMENT: How have these feelings affected your ability to do daily activities? Have you had more difficulty than usual doing your normal daily activities? (e.g., getting better, same, worse; self-care, school, work, interactions)     Yes able to do same day to day activities once symptoms resolve 6. HISTORY: Have you felt this way before? Have you ever been diagnosed with an anxiety problem in the past? (e.g., generalized anxiety disorder, panic attacks, PTSD). If Yes, ask: How was this problem treated? (e.g., medicines, counseling, etc.)     Yes, has not seen a provider for symptoms.    7. RISK OF HARM - SUICIDAL IDEATION: Do you ever have thoughts of hurting or killing yourself? If Yes, ask:  Do you have these feelings now? Do you have a plan on how you would do this?     Denies  8. TREATMENT:  What has been done so far to treat this anxiety? (e.g., medicines, relaxation strategies). What has helped?     Nothing   9. THERAPIST: Do you have a counselor or therapist? If Yes, ask: What is their name?     Denies  10. OTHER SYMPTOMS: Do you have any other symptoms? (e.g., feeling depressed, trouble concentrating, trouble sleeping, trouble breathing, palpitations or fast heartbeat, chest pain, sweating, nausea, or diarrhea)       Denies  Protocols used: Anxiety and Panic Attack-A-AH

## 2024-06-07 NOTE — Telephone Encounter (Signed)
 Noted  Pt has appt.  KP

## 2024-06-11 ENCOUNTER — Ambulatory Visit (INDEPENDENT_AMBULATORY_CARE_PROVIDER_SITE_OTHER): Payer: Medicare (Managed Care) | Admitting: Internal Medicine

## 2024-06-11 VITALS — BP 142/82 | HR 70 | Ht 65.0 in | Wt 178.0 lb

## 2024-06-11 DIAGNOSIS — I10 Essential (primary) hypertension: Secondary | ICD-10-CM | POA: Diagnosis not present

## 2024-06-11 DIAGNOSIS — F410 Panic disorder [episodic paroxysmal anxiety] without agoraphobia: Secondary | ICD-10-CM | POA: Insufficient documentation

## 2024-06-11 MED ORDER — IRBESARTAN 300 MG PO TABS: 300.0000 mg | ORAL_TABLET | Freq: Every day | ORAL | 0 refills | Status: DC

## 2024-06-11 MED ORDER — CLONIDINE HCL 0.1 MG PO TABS: 0.1000 mg | ORAL_TABLET | Freq: Two times a day (BID) | ORAL | 0 refills | Status: AC

## 2024-06-11 MED ORDER — SPIRONOLACTONE 25 MG PO TABS: 25.0000 mg | ORAL_TABLET | Freq: Every day | ORAL | 0 refills | Status: AC

## 2024-06-11 MED ORDER — HYDROXYZINE PAMOATE 25 MG PO CAPS: 25.0000 mg | ORAL_CAPSULE | Freq: Three times a day (TID) | ORAL | 0 refills | Status: AC | PRN

## 2024-06-11 MED ORDER — AMLODIPINE BESYLATE 10 MG PO TABS: 10.0000 mg | ORAL_TABLET | Freq: Every day | ORAL | 0 refills | Status: AC

## 2024-06-11 NOTE — Assessment & Plan Note (Signed)
 Longstanding issues worsening recently. No indication for daily medications - will try prn treatment with lose dose vistaril. Follow up next month at previously scheduled appointment.

## 2024-06-11 NOTE — Progress Notes (Signed)
 Date:  06/11/2024   Name:  Edward Rogers   DOB:  03/12/1946   MRN:  [id number]   Chief Complaint: Anxiety  Anxiety Presents for initial visit. The problem has been gradually worsening. Symptoms include excessive worry, nervous/anxious behavior and panic. Patient reports no chest pain, decreased concentration, dizziness, palpitations, shortness of breath or suicidal ideas. Symptoms occur occasionally. The symptoms are aggravated by family issues.   Past treatments include nothing.  He has had anxiety for many years and intermittent panic attacks that feel like impending doom and the need to escape.  No dizziness, chest pain or shortness of breath.  Recently his symptoms seem to be worsening - mainly due to strife with his long time girl friend of 38 years.  He declined to be more specific but states that there is no legal issue or abuse involved.  Review of Systems  Constitutional:  Negative for fatigue and unexpected weight change.  HENT:  Negative for nosebleeds.   Eyes:  Negative for visual disturbance.  Respiratory:  Negative for cough, chest tightness, shortness of breath and wheezing.   Cardiovascular:  Negative for chest pain, palpitations and leg swelling.  Gastrointestinal:  Negative for abdominal pain, constipation and diarrhea.  Neurological:  Negative for dizziness, weakness, light-headedness and headaches.  Psychiatric/Behavioral:  Negative for decreased concentration, dysphoric mood, sleep disturbance and suicidal ideas. The patient is nervous/anxious.      Lab Results  Component Value Date   NA 142 11/03/2023   K 3.9 11/03/2023   CO2 21 11/03/2023   GLUCOSE 115 (H) 11/03/2023   BUN 19 11/03/2023   CREATININE 1.82 (H) 11/03/2023   CALCIUM  9.8 11/03/2023   EGFR 37 (L) 11/03/2023   GFRNONAA 40 (L) 07/07/2020   Lab Results  Component Value Date   CHOL 154 07/04/2023   HDL 26 (L) 07/04/2023   LDLCALC 74 07/04/2023   TRIG 332 (H) 07/04/2023   CHOLHDL 5.9  (H) 07/04/2023   Lab Results  Component Value Date   TSH 1.200 06/30/2021   Lab Results  Component Value Date   HGBA1C 6.4 (H) 11/03/2023   Lab Results  Component Value Date   WBC 4.9 11/03/2023   HGB 14.0 11/03/2023   HCT 44.8 11/03/2023   MCV 88 11/03/2023   PLT 258 11/03/2023   Lab Results  Component Value Date   ALT 17 11/03/2023   AST 20 11/03/2023   ALKPHOS 98 11/03/2023   BILITOT 0.4 11/03/2023   No results found for: MARIEN BOLLS, VD25OH   Patient Active Problem List   Diagnosis Date Noted   Panic disorder 06/11/2024   Chronic congestion of paranasal sinus 07/04/2023   Prediabetes 07/01/2021   Lumbar radiculopathy 07/17/2020   Aortic atherosclerosis 07/06/2020   Chronic kidney disease, stage 3a (HCC) 07/06/2020   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   Arthritis of joint of toe 06/01/2017   Chronic left-sided low back pain with left-sided sciatica 11/30/2016   Gout 04/11/2015   Dyslipidemia 04/11/2015   Essential (primary) hypertension 04/11/2015   Sarcoidosis 04/11/2015   Other allergic rhinitis 04/11/2015   Tobacco use disorder, moderate, in sustained remission 04/11/2015   Elevated PSA 04/11/2015    No Known Allergies  Past Surgical History:  Procedure Laterality Date   COLONOSCOPY  2009   normal   PROSTATE BIOPSY  2014   normal    Social History   Tobacco Use   Smoking status: Former    Current packs/day: 0.00  Average packs/day: 0.5 packs/day for 30.0 years (15.0 ttl pk-yrs)    Types: Cigarettes    Start date: 08/30/1982    Quit date: 08/30/2012    Years since quitting: 11.7   Smokeless tobacco: Never  Vaping Use   Vaping status: Never Used  Substance Use Topics   Alcohol use: Yes    Alcohol/week: 0.0 standard drinks of alcohol    Comment: rarely   Drug use: No     Medication list has been reviewed and updated.  Current Meds  Medication Sig   albuterol  (VENTOLIN  HFA) 108 (90 Base) MCG/ACT inhaler  INHALE 2 PUFFS BY MOUTH EVERY 6 HOURS AS NEEDED FOR WHEEZING FOR SHORTNESS OF BREATH   allopurinol  (ZYLOPRIM ) 100 MG tablet Take 2 tablets by mouth once daily   Azelastine  HCl 137 MCG/SPRAY SOLN USE 2 SPRAY(S) IN EACH NOSTRIL TWICE DAILY AS DIRECTED   colchicine  0.6 MG tablet Take 1 tablet by mouth twice daily as needed   fluticasone  (FLONASE ) 50 MCG/ACT nasal spray Use 2 spray(s) in each nostril once daily   gabapentin  (NEURONTIN ) 300 MG capsule Take 1 capsule by mouth at bedtime   hydrOXYzine (VISTARIL) 25 MG capsule Take 1 capsule (25 mg total) by mouth every 8 (eight) hours as needed for anxiety.   MIEBO 1.338 GM/ML SOLN Apply 1 drop to eye 4 (four) times daily.   Prednisol Ace-Moxiflox-Bromfen 1-0.5-0.075 % SUSP Apply to eye.   rosuvastatin  (CRESTOR ) 40 MG tablet Take 1 tablet by mouth once daily   [DISCONTINUED] amLODipine  (NORVASC ) 10 MG tablet Take 1 tablet by mouth once daily   [DISCONTINUED] cloNIDine  (CATAPRES ) 0.1 MG tablet Take 1 tablet by mouth twice daily   [DISCONTINUED] irbesartan  (AVAPRO ) 300 MG tablet Take 1 tablet by mouth once daily   [DISCONTINUED] spironolactone  (ALDACTONE ) 25 MG tablet Take 1 tablet (25 mg total) by mouth daily.       06/11/2024   10:34 AM 11/03/2023   11:04 AM 07/04/2023    8:28 AM 06/09/2023    4:25 PM  GAD 7 : Generalized Anxiety Score  Nervous, Anxious, on Edge 1 1 0 0  Control/stop worrying 1 0 0 0  Worry too much - different things 1 0 0 0  Trouble relaxing 0 0 0 0  Restless 0 0 0 0  Easily annoyed or irritable 3 0 0 0  Afraid - awful might happen 0 0 0 0  Total GAD 7 Score 6 1 0 0  Anxiety Difficulty Somewhat difficult  Not difficult at all Not difficult at all       06/11/2024   10:34 AM 11/03/2023   11:04 AM 07/04/2023    8:27 AM  Depression screen PHQ 2/9  Decreased Interest 1 0 0  Down, Depressed, Hopeless 1 0 0  PHQ - 2 Score 2 0 0  Altered sleeping 0 0 0  Tired, decreased energy 0 0 0  Change in appetite 0 0 0  Feeling bad  or failure about yourself  0 0 0  Trouble concentrating 0 0 0  Moving slowly or fidgety/restless 0 0 0  Suicidal thoughts 0 0 0  PHQ-9 Score 2 0 0  Difficult doing work/chores Not difficult at all Not difficult at all Not difficult at all    BP Readings from Last 3 Encounters:  06/11/24 (!) 142/82  11/03/23 (!) 160/92  07/04/23 134/76    Physical Exam Vitals and nursing note reviewed.  Constitutional:      General: He is not  in acute distress.    Appearance: Normal appearance. He is well-developed.  HENT:     Head: Normocephalic and atraumatic.  Cardiovascular:     Rate and Rhythm: Normal rate and regular rhythm.  Pulmonary:     Effort: Pulmonary effort is normal. No respiratory distress.     Breath sounds: No wheezing or rhonchi.  Musculoskeletal:     Cervical back: Normal range of motion.     Right lower leg: No edema.     Left lower leg: No edema.  Lymphadenopathy:     Cervical: No cervical adenopathy.  Skin:    General: Skin is warm and dry.     Findings: No rash.  Neurological:     Mental Status: He is alert and oriented to person, place, and time.  Psychiatric:        Attention and Perception: Attention normal.        Mood and Affect: Mood is anxious.        Speech: Speech normal.        Behavior: Behavior normal.        Thought Content: Thought content does not include suicidal ideation. Thought content does not include homicidal or suicidal plan.        Cognition and Memory: Cognition normal.        Judgment: Judgment normal.     Wt Readings from Last 3 Encounters:  06/11/24 178 lb (80.7 kg)  11/03/23 181 lb 8 oz (82.3 kg)  07/04/23 190 lb 3.2 oz (86.3 kg)    BP (!) 142/82   Pulse 70   Ht 5' 5 (1.651 m)   Wt 178 lb (80.7 kg)   SpO2 98%   BMI 29.62 kg/m   Assessment and Plan:  Problem List Items Addressed This Visit       Unprioritized   Essential (primary) hypertension - Primary (Chronic)   Poorly controlled blood pressure today. Current  regimen is irbesartan  and amlodipine .  He is supposed to be taking spironolactone  and clonidine  but he has been out for months. I have refilled all four medications and encourage him to take them all every day.  Will re-evaluate at visit next month. No medication side effects noted.        Relevant Medications   irbesartan  (AVAPRO ) 300 MG tablet   amLODipine  (NORVASC ) 10 MG tablet   spironolactone  (ALDACTONE ) 25 MG tablet   cloNIDine  (CATAPRES ) 0.1 MG tablet   Panic disorder   Longstanding issues worsening recently. No indication for daily medications - will try prn treatment with lose dose vistaril. Follow up next month at previously scheduled appointment.      Relevant Medications   hydrOXYzine (VISTARIL) 25 MG capsule    No follow-ups on file.    Leita HILARIO Adie, MD [facility] [place]

## 2024-06-11 NOTE — Assessment & Plan Note (Addendum)
 Poorly controlled blood pressure today. Current regimen is irbesartan  and amlodipine .  He is supposed to be taking spironolactone  and clonidine  but he has been out for months. I have refilled all four medications and encourage him to take them all every day.  Will re-evaluate at visit next month. No medication side effects noted.

## 2024-06-18 DIAGNOSIS — H25811 Combined forms of age-related cataract, right eye: Secondary | ICD-10-CM | POA: Diagnosis not present

## 2024-06-26 DIAGNOSIS — H25811 Combined forms of age-related cataract, right eye: Secondary | ICD-10-CM | POA: Diagnosis not present

## 2024-06-26 DIAGNOSIS — H52223 Regular astigmatism, bilateral: Secondary | ICD-10-CM | POA: Diagnosis not present

## 2024-06-27 DIAGNOSIS — H25812 Combined forms of age-related cataract, left eye: Secondary | ICD-10-CM | POA: Diagnosis not present

## 2024-07-04 ENCOUNTER — Telehealth: Payer: Self-pay | Admitting: Internal Medicine

## 2024-07-04 NOTE — Telephone Encounter (Signed)
 Copied from CRM #[id number]. Topic: Medicare AWV >> Jul 04, 2024 10:56 AM Nathanel DEL wrote: Reason for CRM: Called LVM 07/04/2024 to schedule AWV. Please schedule office or virtual visits  Nathanel Paschal; Care Guide Ambulatory Clinical Support [place] [facility] Direct Dial: [phone]

## 2024-07-05 ENCOUNTER — Ambulatory Visit (INDEPENDENT_AMBULATORY_CARE_PROVIDER_SITE_OTHER): Payer: Medicare (Managed Care) | Admitting: Internal Medicine

## 2024-07-05 ENCOUNTER — Ambulatory Visit: Payer: Medicare (Managed Care)

## 2024-07-05 ENCOUNTER — Encounter: Payer: Self-pay | Admitting: Internal Medicine

## 2024-07-05 ENCOUNTER — Encounter: Payer: Medicare (Managed Care) | Admitting: Internal Medicine

## 2024-07-05 VITALS — BP 132/86 | HR 86 | Ht 65.0 in | Wt 174.0 lb

## 2024-07-05 VITALS — BP 126/86 | HR 86 | Ht 65.0 in | Wt 174.0 lb

## 2024-07-05 DIAGNOSIS — M10079 Idiopathic gout, unspecified ankle and foot: Secondary | ICD-10-CM | POA: Diagnosis not present

## 2024-07-05 DIAGNOSIS — I10 Essential (primary) hypertension: Secondary | ICD-10-CM

## 2024-07-05 DIAGNOSIS — D485 Neoplasm of uncertain behavior of skin: Secondary | ICD-10-CM

## 2024-07-05 DIAGNOSIS — Z0000 Encounter for general adult medical examination without abnormal findings: Secondary | ICD-10-CM

## 2024-07-05 DIAGNOSIS — R972 Elevated prostate specific antigen [PSA]: Secondary | ICD-10-CM | POA: Diagnosis not present

## 2024-07-05 DIAGNOSIS — E785 Hyperlipidemia, unspecified: Secondary | ICD-10-CM | POA: Diagnosis not present

## 2024-07-05 DIAGNOSIS — N1831 Chronic kidney disease, stage 3a: Secondary | ICD-10-CM

## 2024-07-05 DIAGNOSIS — R7303 Prediabetes: Secondary | ICD-10-CM

## 2024-07-05 NOTE — Progress Notes (Signed)
 Date:  07/05/2024   Name:  Edward Rogers   DOB:  August 07, 1946   MRN:  [id number]   Chief Complaint: Annual Exam Edward Rogers is a 78 y.o. male who presents today for his Complete Annual Exam. He feels well. He reports exercising - some. He reports he is sleeping well.   Health Maintenance  Topic Date Due   Zoster (Shingles) Vaccine (1 of 2) 12/29/1962   Medicare Annual Wellness Visit  04/25/2024   COVID-19 Vaccine (6 - 2025-26 season) 04/29/2024   DTaP/Tdap/Td vaccine (1 - Tdap) 11/02/2024*   Flu Shot  11/26/2024*   Pneumococcal Vaccine for age over 64  Completed   Meningitis B Vaccine  Aged Out   Colon Cancer Screening  Discontinued   Hepatitis C Screening  Discontinued  *Topic was postponed. The date shown is not the original due date.   Lab Results  Component Value Date   PSA1 0.5 06/30/2022   PSA1 1.3 06/30/2021   PSA1 0.5 06/13/2018   PSA 0.6 04/24/2014    Hypertension This is a chronic problem. The problem is controlled. Pertinent negatives include no chest pain, headaches, palpitations or shortness of breath.  Hyperlipidemia This is a chronic problem. The problem is controlled. Pertinent negatives include no chest pain or shortness of breath. Current antihyperlipidemic treatment includes statins.  Rash This is a new problem. The current episode started more than 1 year ago. The problem has been waxing and waning since onset. The affected locations include the right lower leg. Rash characteristics: area scabs up then peels off but never heals. Pertinent negatives include no cough, diarrhea, fatigue or shortness of breath.    Review of Systems  Constitutional:  Negative for fatigue and unexpected weight change.  HENT:  Negative for nosebleeds and trouble swallowing.   Eyes:  Negative for visual disturbance.  Respiratory:  Negative for cough, chest tightness, shortness of breath and wheezing.   Cardiovascular:  Negative for chest pain, palpitations and leg  swelling.  Gastrointestinal:  Negative for abdominal pain, constipation and diarrhea.  Genitourinary:  Negative for dysuria, frequency and urgency.  Skin:  Positive for rash.  Neurological:  Negative for dizziness, weakness, light-headedness and headaches.  Psychiatric/Behavioral:  Negative for dysphoric mood and sleep disturbance. The patient is not nervous/anxious.      Lab Results  Component Value Date   NA 142 11/03/2023   K 3.9 11/03/2023   CO2 21 11/03/2023   GLUCOSE 115 (H) 11/03/2023   BUN 19 11/03/2023   CREATININE 1.82 (H) 11/03/2023   CALCIUM  9.8 11/03/2023   EGFR 37 (L) 11/03/2023   GFRNONAA 40 (L) 07/07/2020   Lab Results  Component Value Date   CHOL 154 07/04/2023   HDL 26 (L) 07/04/2023   LDLCALC 74 07/04/2023   TRIG 332 (H) 07/04/2023   CHOLHDL 5.9 (H) 07/04/2023   Lab Results  Component Value Date   TSH 1.200 06/30/2021   Lab Results  Component Value Date   HGBA1C 6.4 (H) 11/03/2023   Lab Results  Component Value Date   WBC 4.9 11/03/2023   HGB 14.0 11/03/2023   HCT 44.8 11/03/2023   MCV 88 11/03/2023   PLT 258 11/03/2023   Lab Results  Component Value Date   ALT 17 11/03/2023   AST 20 11/03/2023   ALKPHOS 98 11/03/2023   BILITOT 0.4 11/03/2023   No results found for: MARIEN BOLLS, VD25OH   Patient Active Problem List   Diagnosis Date Noted  Panic disorder 06/11/2024   Chronic congestion of paranasal sinus 07/04/2023   Prediabetes 07/01/2021   Lumbar radiculopathy 07/17/2020   Aortic atherosclerosis 07/06/2020   Chronic kidney disease, stage 3a (HCC) 07/06/2020   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   Arthritis of joint of toe 06/01/2017   Chronic left-sided low back pain with left-sided sciatica 11/30/2016   Gout 04/11/2015   Dyslipidemia 04/11/2015   Essential (primary) hypertension 04/11/2015   Sarcoidosis 04/11/2015   Other allergic rhinitis 04/11/2015   Tobacco use disorder, moderate, in sustained  remission 04/11/2015   Elevated PSA 04/11/2015    No Known Allergies  Past Surgical History:  Procedure Laterality Date   COLONOSCOPY  2009   normal   PROSTATE BIOPSY  2014   normal    Social History   Tobacco Use   Smoking status: Former    Current packs/day: 0.00    Average packs/day: 0.5 packs/day for 30.0 years (15.0 ttl pk-yrs)    Types: Cigarettes    Start date: 08/30/1982    Quit date: 08/30/2012    Years since quitting: 11.8   Smokeless tobacco: Never  Vaping Use   Vaping status: Never Used  Substance Use Topics   Alcohol use: Not Currently    Comment: None   Drug use: No     Medication list has been reviewed and updated.  Current Meds  Medication Sig   albuterol  (VENTOLIN  HFA) 108 (90 Base) MCG/ACT inhaler INHALE 2 PUFFS BY MOUTH EVERY 6 HOURS AS NEEDED FOR WHEEZING FOR SHORTNESS OF BREATH   allopurinol  (ZYLOPRIM ) 100 MG tablet Take 2 tablets by mouth once daily   amLODipine  (NORVASC ) 10 MG tablet Take 1 tablet (10 mg total) by mouth daily.   Azelastine  HCl 137 MCG/SPRAY SOLN USE 2 SPRAY(S) IN EACH NOSTRIL TWICE DAILY AS DIRECTED   cloNIDine  (CATAPRES ) 0.1 MG tablet Take 1 tablet (0.1 mg total) by mouth 2 (two) times daily.   colchicine  0.6 MG tablet Take 1 tablet by mouth twice daily as needed   fluticasone  (FLONASE ) 50 MCG/ACT nasal spray Use 2 spray(s) in each nostril once daily   gabapentin  (NEURONTIN ) 300 MG capsule Take 1 capsule by mouth at bedtime   hydrOXYzine (VISTARIL) 25 MG capsule Take 1 capsule (25 mg total) by mouth every 8 (eight) hours as needed for anxiety.   irbesartan  (AVAPRO ) 300 MG tablet Take 1 tablet (300 mg total) by mouth daily.   MIEBO 1.338 GM/ML SOLN Apply 1 drop to eye 4 (four) times daily.   rosuvastatin  (CRESTOR ) 40 MG tablet Take 1 tablet by mouth once daily   spironolactone  (ALDACTONE ) 25 MG tablet Take 1 tablet (25 mg total) by mouth daily.       07/05/2024    8:17 AM 06/11/2024   10:34 AM 11/03/2023   11:04 AM 07/04/2023     8:28 AM  GAD 7 : Generalized Anxiety Score  Nervous, Anxious, on Edge 0 1 1 0  Control/stop worrying 0 1 0 0  Worry too much - different things 0 1 0 0  Trouble relaxing 0 0 0 0  Restless 0 0 0 0  Easily annoyed or irritable 0 3 0 0  Afraid - awful might happen 0 0 0 0  Total GAD 7 Score 0 6 1 0  Anxiety Difficulty Not difficult at all Somewhat difficult  Not difficult at all       07/05/2024    8:17 AM 06/11/2024   10:34 AM 11/03/2023   11:04  AM  Depression screen PHQ 2/9  Decreased Interest 0 1 0  Down, Depressed, Hopeless 0 1 0  PHQ - 2 Score 0 2 0  Altered sleeping 0 0 0  Tired, decreased energy 0 0 0  Change in appetite 0 0 0  Feeling bad or failure about yourself  0 0 0  Trouble concentrating 0 0 0  Moving slowly or fidgety/restless 0 0 0  Suicidal thoughts 0 0 0  PHQ-9 Score 0 2  0   Difficult doing work/chores Not difficult at all Not difficult at all Not difficult at all     Data saved with a previous flowsheet row definition    BP Readings from Last 3 Encounters:  07/05/24 126/86  06/11/24 (!) 142/82  11/03/23 (!) 160/92    Physical Exam Vitals and nursing note reviewed.  Constitutional:      Appearance: Normal appearance. He is well-developed.  HENT:     Head: Normocephalic.     Right Ear: Tympanic membrane, ear canal and external ear normal.     Left Ear: Tympanic membrane, ear canal and external ear normal.     Nose: Nose normal.  Eyes:     Conjunctiva/sclera: Conjunctivae normal.     Pupils: Pupils are equal, round, and reactive to light.  Neck:     Thyroid: No thyromegaly.     Vascular: No carotid bruit.  Cardiovascular:     Rate and Rhythm: Normal rate and regular rhythm.     Heart sounds: Normal heart sounds.  Pulmonary:     Effort: Pulmonary effort is normal.     Breath sounds: Normal breath sounds. No wheezing.  Chest:  Breasts:    Right: No mass.     Left: No mass.  Abdominal:     General: Bowel sounds are normal.     Palpations:  Abdomen is soft.     Tenderness: There is no abdominal tenderness.  Musculoskeletal:        General: Normal range of motion.     Cervical back: Normal range of motion and neck supple.     Right lower leg: No edema.     Left lower leg: No edema.  Lymphadenopathy:     Cervical: No cervical adenopathy.  Skin:    General: Skin is warm and dry.     Capillary Refill: Capillary refill takes less than 2 seconds.  Neurological:     General: No focal deficit present.     Mental Status: He is alert and oriented to person, place, and time.     Deep Tendon Reflexes: Reflexes are normal and symmetric.  Psychiatric:        Attention and Perception: Attention normal.        Mood and Affect: Mood normal.        Thought Content: Thought content normal.     Wt Readings from Last 3 Encounters:  07/05/24 174 lb (78.9 kg)  06/11/24 178 lb (80.7 kg)  11/03/23 181 lb 8 oz (82.3 kg)    BP 126/86   Pulse 86   Ht 5' 5 (1.651 m)   Wt 174 lb (78.9 kg)   SpO2 98%   BMI 28.96 kg/m   Assessment and Plan:  Problem List Items Addressed This Visit       Unprioritized   Gout   No recent episodes      Relevant Orders   Uric acid   Dyslipidemia (Chronic)   On Crestor  without side effects. Lab Results  Component Value Date   LDLCALC 74 07/04/2023         Relevant Orders   Lipid panel   Essential (primary) hypertension (Chronic)   Well controlled blood pressure today. Current regimen is irbesartan  and amlodipine  with clonidine  and spironolactone . No medication side effects noted.        Relevant Orders   CBC with Differential/Platelet   Comprehensive metabolic panel with GFR   TSH   Urinalysis, Routine w reflex microscopic   Elevated PSA   He denies urinary symptoms  No recent PSA or Urology follow up      Relevant Orders   PSA   Chronic kidney disease, stage 3a (HCC) (Chronic)   Monitoring regularly. Stable GFR in the 40's      Relevant Orders   Comprehensive metabolic  panel with GFR   Prediabetes (Chronic)   Controlled with diet only. Lab Results  Component Value Date   HGBA1C 6.4 (H) 11/03/2023         Relevant Orders   Hemoglobin A1c   Other Visit Diagnoses       Annual physical exam    -  Primary   declines flu vaccine will get Covid vaccine at the pharmacy     Neoplasm of uncertain behavior of skin       Relevant Orders   Ambulatory referral to Dermatology      MAW done today by CMA. He plans to change PCP to [facility] [place].  Recommend follow up in 4-6 months. No follow-ups on file.    Leita HILARIO Adie, MD [facility] [place]

## 2024-07-05 NOTE — Assessment & Plan Note (Signed)
 Controlled with diet only. Lab Results  Component Value Date   HGBA1C 6.4 (H) 11/03/2023

## 2024-07-05 NOTE — Assessment & Plan Note (Addendum)
 Well controlled blood pressure today. Current regimen is irbesartan  and amlodipine  with clonidine  and spironolactone . No medication side effects noted.

## 2024-07-05 NOTE — Assessment & Plan Note (Signed)
 He denies urinary symptoms  No recent PSA or Urology follow up

## 2024-07-05 NOTE — Assessment & Plan Note (Signed)
 On Crestor  without side effects. Lab Results  Component Value Date   LDLCALC 74 07/04/2023

## 2024-07-05 NOTE — Patient Instructions (Signed)
 Edward Rogers,  Thank you for taking the time for your Medicare Wellness Visit. I appreciate your continued commitment to your health goals. Please review the care plan we discussed, and feel free to reach out if I can assist you further.  Please note that Annual Wellness Visits do not include a physical exam. Some assessments may be limited, especially if the visit was conducted virtually. If needed, we may recommend an in-person follow-up with your provider.  Ongoing Care Seeing your primary care provider every 3 to 6 months helps us  monitor your health and provide consistent, personalized care.   Referrals If a referral was made during today's visit and you haven't received any updates within two weeks, please contact the referred provider directly to check on the status.  Recommended Screenings:  Health Maintenance  Topic Date Due   Zoster (Shingles) Vaccine (1 of 2) 12/29/1964   Medicare Annual Wellness Visit  04/25/2024   COVID-19 Vaccine (6 - 2025-26 season) 04/29/2024   DTaP/Tdap/Td vaccine (1 - Tdap) 11/02/2024*   Flu Shot  11/26/2024*   Pneumococcal Vaccine for age over 15  Completed   Meningitis B Vaccine  Aged Out   Colon Cancer Screening  Discontinued   Hepatitis C Screening  Discontinued  *Topic was postponed. The date shown is not the original due date.       07/05/2024    8:57 AM  Advanced Directives  Does Patient Have a Medical Advance Directive? Yes  Type of Estate Agent of [place];Living will  Does patient want to make changes to medical advance directive? No - Patient declined  Copy of Healthcare Power of Attorney in Chart? No - copy requested    Vision: Annual vision screenings are recommended for early detection of glaucoma, cataracts, and diabetic retinopathy. These exams can also reveal signs of chronic conditions such as diabetes and high blood pressure.  Dental: Annual dental screenings help detect early signs of oral cancer, gum  disease, and other conditions linked to overall health, including heart disease and diabetes.  Please see the attached documents for additional preventive care recommendations.

## 2024-07-05 NOTE — Assessment & Plan Note (Signed)
 No recent episodes

## 2024-07-05 NOTE — Progress Notes (Signed)
 Subjective:   Edward Rogers is a 78 y.o. male who presents for a Medicare Annual Wellness Visit.  Allergies (verified) Patient has no known allergies.   History: Past Medical History:  Diagnosis Date   Arthritis    None   Hypertension 1969   Blood is better now   Past Surgical History:  Procedure Laterality Date   COLONOSCOPY  2009   normal   PROSTATE BIOPSY  2014   normal   Family History  Problem Relation Age of Onset   Cancer Mother    Arthritis Mother    Asthma Mother    Heart failure Father    Social History   Occupational History   Not on file  Tobacco Use   Smoking status: Former    Current packs/day: 0.00    Average packs/day: 0.5 packs/day for 30.0 years (15.0 ttl pk-yrs)    Types: Cigarettes    Start date: 08/30/1982    Quit date: 08/30/2012    Years since quitting: 11.8   Smokeless tobacco: Never  Vaping Use   Vaping status: Never Used  Substance and Sexual Activity   Alcohol use: Not Currently    Comment: None   Drug use: No   Sexual activity: Not Currently   Tobacco Counseling Counseling given: Not Answered  SDOH Screenings   Food Insecurity: No Food Insecurity (07/05/2024)  Recent Concern: Food Insecurity - Food Insecurity Present (06/11/2024)  Housing: Unknown (07/05/2024)  Transportation Needs: No Transportation Needs (07/05/2024)  Utilities: Not At Risk (07/05/2024)  Alcohol Screen: Low Risk  (04/26/2023)  Depression (PHQ2-9): Low Risk  (07/05/2024)  Financial Resource Strain: Medium Risk (06/11/2024)  Physical Activity: Sufficiently Active (07/05/2024)  Social Connections: Moderately Isolated (07/05/2024)  Stress: No Stress Concern Present (07/05/2024)  Tobacco Use: Medium Risk (07/05/2024)  Health Literacy: Adequate Health Literacy (07/05/2024)   Depression Screen    07/05/2024    8:17 AM 06/11/2024   10:34 AM 11/03/2023   11:04 AM 07/04/2023    8:27 AM 06/09/2023    4:25 PM 04/26/2023    8:14 AM 04/20/2022    8:22 AM  PHQ 2/9  Scores  PHQ - 2 Score 0 2 0 0 0 0 0  PHQ- 9 Score 0 2  0  0  0  0       Data saved with a previous flowsheet row definition     Goals Addressed             This Visit's Progress    DIET - EAT MORE FRUITS AND VEGETABLES   On track    DIET - INCREASE WATER INTAKE   On track    Recommend drinking 6-8 glasses of water per day        Visit info / Clinical Intake: Medicare Wellness Visit Type:: Subsequent Annual Wellness Visit Medicare Wellness Visit Mode:: In-person (required for WTM) Interpreter Needed?: No Pre-visit prep was completed: yes AWV questionnaire completed by patient prior to visit?: no Living arrangements:: (!) lives alone Patient's Overall Health Status Rating: good Typical amount of pain: none Does pain affect daily life?: no Are you currently prescribed opioids?: no  Dietary Habits and Nutritional Risks How many meals a day?: (!) 1 Eats fruit and vegetables daily?: yes Most meals are obtained by: preparing own meals; eating out Diabetic:: no  Functional Status Activities of Daily Living (to include ambulation/medication): Independent Ambulation: Independent Medication Administration: Independent Home Management: Independent Manage your own finances?: yes Primary transportation is: driving Concerns about vision?: ROLLEN)  yes (Patient havign cataract surgery with [facility]) Concerns about hearing?: no  Fall Screening Falls in the past year?: 0 Number of falls in past year: 0 Was there an injury with Fall?: 0 Fall Risk Category Calculator: 0 Patient Fall Risk Level: Low Fall Risk  Fall Risk Patient at Risk for Falls Due to: No Fall Risks Fall risk Follow up: Falls evaluation completed  Home and Transportation Safety: All rugs have non-skid backing?: yes All stairs or steps have railings?: yes Grab bars in the bathtub or shower?: (!) no Have non-skid surface in bathtub or shower?: yes Good home lighting?: yes Regular seat belt use?:  yes Hospital stays in the last year:: no  Cognitive Assessment Difficulty concentrating, remembering, or making decisions? : no Will 6CIT or Mini Cog be Completed: yes What year is it?: 0 points What month is it?: 0 points Give patient an address phrase to remember (5 components): dog cat tree ball car About what time is it?: 0 points Count backwards from 20 to 1: 0 points Say the months of the year in reverse: 0 points Repeat the address phrase from earlier: 2 points 6 CIT Score: 2 points  Advance Directives (For Healthcare) Does Patient Have a Medical Advance Directive?: Yes Does patient want to make changes to medical advance directive?: No - Patient declined Type of Advance Directive: Healthcare Power of [place]; Living will Copy of Healthcare Power of Attorney in Chart?: No - copy requested Copy of Living Will in Chart?: No - copy requested  Reviewed/Updated  Reviewed/Updated: All        Objective:    Today's Vitals   07/05/24 0856  BP: 132/86  Pulse: 86  SpO2: 98%  Weight: 174 lb (78.9 kg)  Height: 5' 5 (1.651 m)   Body mass index is 28.96 kg/m.  Current Medications (verified) Outpatient Encounter Medications as of 07/05/2024  Medication Sig   albuterol  (VENTOLIN  HFA) 108 (90 Base) MCG/ACT inhaler INHALE 2 PUFFS BY MOUTH EVERY 6 HOURS AS NEEDED FOR WHEEZING FOR SHORTNESS OF BREATH   allopurinol  (ZYLOPRIM ) 100 MG tablet Take 2 tablets by mouth once daily   amLODipine  (NORVASC ) 10 MG tablet Take 1 tablet (10 mg total) by mouth daily.   Azelastine  HCl 137 MCG/SPRAY SOLN USE 2 SPRAY(S) IN EACH NOSTRIL TWICE DAILY AS DIRECTED   cloNIDine  (CATAPRES ) 0.1 MG tablet Take 1 tablet (0.1 mg total) by mouth 2 (two) times daily.   colchicine  0.6 MG tablet Take 1 tablet by mouth twice daily as needed   fluticasone  (FLONASE ) 50 MCG/ACT nasal spray Use 2 spray(s) in each nostril once daily   gabapentin  (NEURONTIN ) 300 MG capsule Take 1 capsule by mouth at bedtime    hydrOXYzine (VISTARIL) 25 MG capsule Take 1 capsule (25 mg total) by mouth every 8 (eight) hours as needed for anxiety.   irbesartan  (AVAPRO ) 300 MG tablet Take 1 tablet (300 mg total) by mouth daily.   MIEBO 1.338 GM/ML SOLN Apply 1 drop to eye 4 (four) times daily.   rosuvastatin  (CRESTOR ) 40 MG tablet Take 1 tablet by mouth once daily   spironolactone  (ALDACTONE ) 25 MG tablet Take 1 tablet (25 mg total) by mouth daily.   No facility-administered encounter medications on file as of 07/05/2024.   Hearing/Vision screen No results found. Immunizations and Health Maintenance Health Maintenance  Topic Date Due   Zoster Vaccines- Shingrix (1 of 2) 12/29/1964   COVID-19 Vaccine (6 - 2025-26 season) 04/29/2024   DTaP/Tdap/Td (1 - Tdap)  11/02/2024 (Originally 12/29/1964)   Influenza Vaccine  11/26/2024 (Originally 03/29/2024)   Medicare Annual Wellness (AWV)  07/05/2025   Pneumococcal Vaccine: 50+ Years  Completed   Meningococcal B Vaccine  Aged Out   Colonoscopy  Discontinued   Hepatitis C Screening  Discontinued        Assessment/Plan:  This is a routine wellness examination for Edward Rogers.  Patient Care Team: Justus Leita DEL, MD as PCP - General (Internal Medicine) Helon Clotilda LABOR, PA-C as Physician Assistant (Urology)  I have personally reviewed and noted the following in the patient's chart:   Medical and social history Use of alcohol, tobacco or illicit drugs  Current medications and supplements including opioid prescriptions. Functional ability and status Nutritional status Physical activity Advanced directives List of other physicians Hospitalizations, surgeries, and ER visits in previous 12 months Vitals Screenings to include cognitive, depression, and falls Referrals and appointments  No orders of the defined types were placed in this encounter.  In addition, I have reviewed and discussed with patient certain preventive protocols, quality metrics, and best practice  recommendations. A written personalized care plan for preventive services as well as general preventive health recommendations were provided to patient.   Najee Cowens N Mayank Teuscher, CMA   07/05/2024   Return in 1 year (on 07/05/2025).  After Visit Summary: (In Person-Printed) AVS printed and given to the patient  Nurse Notes: None.

## 2024-07-05 NOTE — Assessment & Plan Note (Signed)
 Monitoring regularly. Stable GFR in the 40's

## 2024-07-06 LAB — URINALYSIS, ROUTINE W REFLEX MICROSCOPIC
Bilirubin, UA: NEGATIVE
Glucose, UA: NEGATIVE
Leukocytes,UA: NEGATIVE
Nitrite, UA: NEGATIVE
RBC, UA: NEGATIVE
Specific Gravity, UA: 1.022 (ref 1.005–1.030)
Urobilinogen, Ur: 0.2 mg/dL (ref 0.2–1.0)
pH, UA: 5.5 (ref 5.0–7.5)

## 2024-07-06 LAB — COMPREHENSIVE METABOLIC PANEL WITH GFR
ALT: 17 IU/L (ref 0–44)
AST: 17 IU/L (ref 0–40)
Albumin: 4.2 g/dL (ref 3.8–4.8)
Alkaline Phosphatase: 80 IU/L (ref 47–123)
BUN/Creatinine Ratio: 12 (ref 10–24)
BUN: 19 mg/dL (ref 8–27)
Bilirubin Total: 0.3 mg/dL (ref 0.0–1.2)
CO2: 21 mmol/L (ref 20–29)
Calcium: 9.8 mg/dL (ref 8.6–10.2)
Chloride: 105 mmol/L (ref 96–106)
Creatinine, Ser: 1.57 mg/dL — ABNORMAL HIGH (ref 0.76–1.27)
Globulin, Total: 3.2 g/dL (ref 1.5–4.5)
Glucose: 108 mg/dL — ABNORMAL HIGH (ref 70–99)
Potassium: 4.3 mmol/L (ref 3.5–5.2)
Sodium: 142 mmol/L (ref 134–144)
Total Protein: 7.4 g/dL (ref 6.0–8.5)
eGFR: 44 mL/min/1.73 — ABNORMAL LOW (ref >59)

## 2024-07-06 LAB — CBC WITH DIFFERENTIAL/PLATELET
Basophils Absolute: 0.1 x10E3/uL (ref 0.0–0.2)
Basos: 1 %
EOS (ABSOLUTE): 0.4 x10E3/uL (ref 0.0–0.4)
Eos: 9 %
Hematocrit: 45.1 % (ref 37.5–51.0)
Hemoglobin: 14 g/dL (ref 13.0–17.7)
Immature Grans (Abs): 0 x10E3/uL (ref 0.0–0.1)
Immature Granulocytes: 0 %
Lymphocytes Absolute: 1.2 x10E3/uL (ref 0.7–3.1)
Lymphs: 26 %
MCH: 27.9 pg (ref 26.6–33.0)
MCHC: 31 g/dL — ABNORMAL LOW (ref 31.5–35.7)
MCV: 90 fL (ref 79–97)
Monocytes Absolute: 0.5 x10E3/uL (ref 0.1–0.9)
Monocytes: 11 %
Neutrophils Absolute: 2.5 x10E3/uL (ref 1.4–7.0)
Neutrophils: 53 %
Platelets: 257 x10E3/uL (ref 150–450)
RBC: 5.02 x10E6/uL (ref 4.14–5.80)
RDW: 14.2 % (ref 11.6–15.4)
WBC: 4.7 x10E3/uL (ref 3.4–10.8)

## 2024-07-06 LAB — LIPID PANEL
Chol/HDL Ratio: 4.2 ratio (ref 0.0–5.0)
Cholesterol, Total: 165 mg/dL (ref 100–199)
HDL: 39 mg/dL — ABNORMAL LOW (ref >39)
LDL Chol Calc (NIH): 89 mg/dL (ref 0–99)
Triglycerides: 218 mg/dL — ABNORMAL HIGH (ref 0–149)
VLDL Cholesterol Cal: 37 mg/dL (ref 5–40)

## 2024-07-06 LAB — PSA: Prostate Specific Ag, Serum: 0.8 ng/mL (ref 0.0–4.0)

## 2024-07-06 LAB — MICROSCOPIC EXAMINATION
Bacteria, UA: NONE SEEN
Casts: NONE SEEN /LPF
Epithelial Cells (non renal): NONE SEEN /HPF (ref 0–10)
WBC, UA: NONE SEEN /HPF (ref 0–5)

## 2024-07-06 LAB — URIC ACID: Uric Acid: 6.8 mg/dL (ref 3.8–8.4)

## 2024-07-06 LAB — HEMOGLOBIN A1C
Est. average glucose Bld gHb Est-mCnc: 131 mg/dL
Hgb A1c MFr Bld: 6.2 % — ABNORMAL HIGH (ref 4.8–5.6)

## 2024-07-06 LAB — TSH: TSH: 1.06 u[IU]/mL (ref 0.450–4.500)

## 2024-07-07 ENCOUNTER — Ambulatory Visit: Payer: Self-pay | Admitting: Internal Medicine

## 2024-07-10 DIAGNOSIS — E669 Obesity, unspecified: Secondary | ICD-10-CM | POA: Diagnosis not present

## 2024-07-10 DIAGNOSIS — H25812 Combined forms of age-related cataract, left eye: Secondary | ICD-10-CM | POA: Diagnosis not present

## 2024-07-10 DIAGNOSIS — Z6831 Body mass index (BMI) 31.0-31.9, adult: Secondary | ICD-10-CM | POA: Diagnosis not present

## 2024-07-10 DIAGNOSIS — Z961 Presence of intraocular lens: Secondary | ICD-10-CM | POA: Diagnosis not present

## 2024-07-10 DIAGNOSIS — Z9841 Cataract extraction status, right eye: Secondary | ICD-10-CM | POA: Diagnosis not present

## 2024-08-05 ENCOUNTER — Other Ambulatory Visit: Payer: Self-pay | Admitting: Internal Medicine

## 2024-08-05 DIAGNOSIS — M1000 Idiopathic gout, unspecified site: Secondary | ICD-10-CM

## 2024-08-06 NOTE — Telephone Encounter (Signed)
 Requested Prescriptions  Pending Prescriptions Disp Refills   allopurinol  (ZYLOPRIM ) 100 MG tablet [Pharmacy Med Name: Allopurinol  100 MG Oral Tablet] 180 tablet 0    Sig: Take 2 tablets by mouth once daily     Endocrinology:  Gout Agents - allopurinol  Failed - 08/06/2024  4:23 PM      Failed - Cr in normal range and within 360 days    Creatinine, Ser  Date Value Ref Range Status  07/05/2024 1.57 (H) 0.76 - 1.27 mg/dL Final         Passed - Uric Acid in normal range and within 360 days    Uric Acid  Date Value Ref Range Status  07/05/2024 6.8 3.8 - 8.4 mg/dL Final    Comment:               Therapeutic target for gout patients: <6.0         Passed - Valid encounter within last 12 months    Recent Outpatient Visits           1 month ago Annual physical exam   [place] [facility], Leita DEL, MD   1 month ago Essential (primary) hypertension   [place] [facility] at [facility], Leita DEL, MD   9 months ago Essential (primary) hypertension   [place] [facility] at [facility], Leita DEL, MD              Passed - CBC within normal limits and completed in the last 12 months    WBC  Date Value Ref Range Status  07/05/2024 4.7 3.4 - 10.8 x10E3/uL Final   RBC  Date Value Ref Range Status  07/05/2024 5.02 4.14 - 5.80 x10E6/uL Final   Hemoglobin  Date Value Ref Range Status  07/05/2024 14.0 13.0 - 17.7 g/dL Final   Hematocrit  Date Value Ref Range Status  07/05/2024 45.1 37.5 - 51.0 % Final   MCHC  Date Value Ref Range Status  07/05/2024 31.0 (L) 31.5 - 35.7 g/dL Final   [facility]  Date Value Ref Range Status  07/05/2024 27.9 26.6 - 33.0 pg Final   MCV  Date Value Ref Range Status  07/05/2024 90 79 - 97 fL Final   No results found for: PLTCOUNTKUC, LABPLAT, POCPLA RDW  Date Value Ref Range Status  07/05/2024 14.2 11.6 - 15.4 % Final

## 2024-08-10 ENCOUNTER — Other Ambulatory Visit: Payer: Self-pay | Admitting: Internal Medicine

## 2024-08-10 DIAGNOSIS — E785 Hyperlipidemia, unspecified: Secondary | ICD-10-CM

## 2024-08-13 NOTE — Telephone Encounter (Signed)
 Requested Prescriptions  Pending Prescriptions Disp Refills   rosuvastatin  (CRESTOR ) 40 MG tablet [Pharmacy Med Name: Rosuvastatin  Calcium  40 MG Oral Tablet] 90 tablet 0    Sig: Take 1 tablet by mouth once daily     Cardiovascular:  Antilipid - Statins 2 Failed - 08/13/2024 12:08 PM      Failed - Cr in normal range and within 360 days    Creatinine, Ser  Date Value Ref Range Status  07/05/2024 1.57 (H) 0.76 - 1.27 mg/dL Final         Failed - Lipid Panel in normal range within the last 12 months    Cholesterol, Total  Date Value Ref Range Status  07/05/2024 165 100 - 199 mg/dL Final   LDL Chol Calc (NIH)  Date Value Ref Range Status  07/05/2024 89 0 - 99 mg/dL Final   HDL  Date Value Ref Range Status  07/05/2024 39 (L) >39 mg/dL Final   Triglycerides  Date Value Ref Range Status  07/05/2024 218 (H) 0 - 149 mg/dL Final         Passed - Patient is not pregnant      Passed - Valid encounter within last 12 months    Recent Outpatient Visits           1 month ago Annual physical exam   [place] [facility] at [facility], Leita DEL, MD   2 months ago Essential (primary) hypertension   [place] [facility] at [facility], Leita DEL, MD   9 months ago Essential (primary) hypertension   [facility] at [facility], Leita DEL, MD

## 2024-09-07 ENCOUNTER — Other Ambulatory Visit: Payer: Self-pay | Admitting: Physician Assistant

## 2024-09-07 DIAGNOSIS — I10 Essential (primary) hypertension: Secondary | ICD-10-CM

## 2024-09-07 MED ORDER — IRBESARTAN 300 MG PO TABS: 300.0000 mg | ORAL_TABLET | Freq: Every day | ORAL | 0 refills | Status: AC

## 2024-09-07 NOTE — Progress Notes (Signed)
 Refill request received from pharmacy via fax for irbesartan .  Patient scheduled to establish with PCP at another clinic on 09/26/2024.  Sent irbesartan , 90 tablets, no refills.
